# Patient Record
Sex: Female | Born: 1945
Health system: Southern US, Community
[De-identification: ages and names within clinical notes are randomized; demographics above are authoritative.]

## PROBLEM LIST (undated history)

## (undated) DIAGNOSIS — R011 Cardiac murmur, unspecified: Secondary | ICD-10-CM

## (undated) DIAGNOSIS — M069 Rheumatoid arthritis, unspecified: Secondary | ICD-10-CM

## (undated) DIAGNOSIS — M3500 Sicca syndrome, unspecified: Secondary | ICD-10-CM

## (undated) DIAGNOSIS — E039 Hypothyroidism, unspecified: Secondary | ICD-10-CM

## (undated) DIAGNOSIS — J189 Pneumonia, unspecified organism: Secondary | ICD-10-CM

## (undated) DIAGNOSIS — C801 Malignant (primary) neoplasm, unspecified: Secondary | ICD-10-CM

## (undated) HISTORY — PX: TONSILLECTOMY: SUR1361

## (undated) HISTORY — PX: DILATION AND CURETTAGE OF UTERUS: SHX78

---

## 1998-01-11 ENCOUNTER — Ambulatory Visit (HOSPITAL_COMMUNITY): Admission: RE | Admit: 1998-01-11 | Discharge: 1998-01-11 | Payer: Self-pay | Admitting: Obstetrics and Gynecology

## 1998-01-30 ENCOUNTER — Other Ambulatory Visit: Admission: RE | Admit: 1998-01-30 | Discharge: 1998-01-30 | Payer: Self-pay | Admitting: Obstetrics and Gynecology

## 1999-01-14 ENCOUNTER — Ambulatory Visit (HOSPITAL_COMMUNITY): Admission: RE | Admit: 1999-01-14 | Discharge: 1999-01-14 | Payer: Self-pay | Admitting: Obstetrics and Gynecology

## 1999-01-14 ENCOUNTER — Encounter: Payer: Self-pay | Admitting: Obstetrics and Gynecology

## 1999-04-03 ENCOUNTER — Other Ambulatory Visit: Admission: RE | Admit: 1999-04-03 | Discharge: 1999-04-03 | Payer: Self-pay | Admitting: Family Medicine

## 2000-01-19 ENCOUNTER — Encounter: Payer: Self-pay | Admitting: Obstetrics and Gynecology

## 2000-01-19 ENCOUNTER — Ambulatory Visit (HOSPITAL_COMMUNITY): Admission: RE | Admit: 2000-01-19 | Discharge: 2000-01-19 | Payer: Self-pay | Admitting: Family Medicine

## 2000-04-20 ENCOUNTER — Other Ambulatory Visit: Admission: RE | Admit: 2000-04-20 | Discharge: 2000-04-20 | Payer: Self-pay | Admitting: Obstetrics and Gynecology

## 2000-12-08 ENCOUNTER — Ambulatory Visit (HOSPITAL_COMMUNITY): Admission: RE | Admit: 2000-12-08 | Discharge: 2000-12-08 | Payer: Self-pay | Admitting: Gastroenterology

## 2001-01-19 ENCOUNTER — Encounter: Payer: Self-pay | Admitting: Obstetrics and Gynecology

## 2001-01-19 ENCOUNTER — Ambulatory Visit (HOSPITAL_COMMUNITY): Admission: RE | Admit: 2001-01-19 | Discharge: 2001-01-19 | Payer: Self-pay | Admitting: Obstetrics and Gynecology

## 2001-01-25 ENCOUNTER — Encounter: Admission: RE | Admit: 2001-01-25 | Discharge: 2001-01-25 | Payer: Self-pay | Admitting: Obstetrics and Gynecology

## 2001-01-25 ENCOUNTER — Encounter: Payer: Self-pay | Admitting: Obstetrics and Gynecology

## 2001-04-29 ENCOUNTER — Other Ambulatory Visit: Admission: RE | Admit: 2001-04-29 | Discharge: 2001-04-29 | Payer: Self-pay | Admitting: Obstetrics and Gynecology

## 2002-01-31 ENCOUNTER — Encounter: Payer: Self-pay | Admitting: Obstetrics and Gynecology

## 2002-01-31 ENCOUNTER — Ambulatory Visit (HOSPITAL_COMMUNITY): Admission: RE | Admit: 2002-01-31 | Discharge: 2002-01-31 | Payer: Self-pay | Admitting: Obstetrics and Gynecology

## 2002-05-18 ENCOUNTER — Other Ambulatory Visit: Admission: RE | Admit: 2002-05-18 | Discharge: 2002-05-18 | Payer: Self-pay | Admitting: Obstetrics and Gynecology

## 2003-02-13 ENCOUNTER — Ambulatory Visit (HOSPITAL_COMMUNITY): Admission: RE | Admit: 2003-02-13 | Discharge: 2003-02-13 | Payer: Self-pay | Admitting: Obstetrics and Gynecology

## 2003-06-05 ENCOUNTER — Other Ambulatory Visit: Admission: RE | Admit: 2003-06-05 | Discharge: 2003-06-05 | Payer: Self-pay | Admitting: Obstetrics and Gynecology

## 2004-02-20 ENCOUNTER — Ambulatory Visit (HOSPITAL_COMMUNITY): Admission: RE | Admit: 2004-02-20 | Discharge: 2004-02-20 | Payer: Self-pay | Admitting: Obstetrics and Gynecology

## 2004-06-20 ENCOUNTER — Other Ambulatory Visit: Admission: RE | Admit: 2004-06-20 | Discharge: 2004-06-20 | Payer: Self-pay | Admitting: Obstetrics and Gynecology

## 2005-04-02 ENCOUNTER — Ambulatory Visit (HOSPITAL_COMMUNITY): Admission: RE | Admit: 2005-04-02 | Discharge: 2005-04-02 | Payer: Self-pay | Admitting: Obstetrics and Gynecology

## 2005-06-24 ENCOUNTER — Other Ambulatory Visit: Admission: RE | Admit: 2005-06-24 | Discharge: 2005-06-24 | Payer: Self-pay | Admitting: Obstetrics and Gynecology

## 2006-04-07 ENCOUNTER — Ambulatory Visit (HOSPITAL_COMMUNITY): Admission: RE | Admit: 2006-04-07 | Discharge: 2006-04-07 | Payer: Self-pay | Admitting: Obstetrics and Gynecology

## 2006-06-02 ENCOUNTER — Ambulatory Visit (HOSPITAL_COMMUNITY): Admission: RE | Admit: 2006-06-02 | Discharge: 2006-06-02 | Payer: Self-pay | Admitting: Gastroenterology

## 2007-04-12 ENCOUNTER — Ambulatory Visit (HOSPITAL_COMMUNITY): Admission: RE | Admit: 2007-04-12 | Discharge: 2007-04-12 | Payer: Self-pay | Admitting: Obstetrics and Gynecology

## 2008-04-17 ENCOUNTER — Ambulatory Visit (HOSPITAL_COMMUNITY): Admission: RE | Admit: 2008-04-17 | Discharge: 2008-04-17 | Payer: Self-pay | Admitting: Obstetrics and Gynecology

## 2010-04-24 ENCOUNTER — Ambulatory Visit (HOSPITAL_COMMUNITY)
Admission: RE | Admit: 2010-04-24 | Discharge: 2010-04-24 | Payer: Self-pay | Source: Home / Self Care | Attending: Obstetrics and Gynecology | Admitting: Obstetrics and Gynecology

## 2010-08-15 NOTE — Op Note (Signed)
NAME:  Sheryl Foster, Sheryl Foster NO.:  [id number]   MEDICAL RECORD NO.:  [id number]          PATIENT TYPE:  AMB   LOCATION:  ENDO                         FACILITY:  [facility]   PHYSICIAN:  Anselmo Rod, M.D.  DATE OF BIRTH:  09-19-1945   DATE OF PROCEDURE:  06/02/2006  DATE OF DISCHARGE:                               OPERATIVE REPORT   PROCEDURE PERFORMED:  Screening colonoscopy.   ENDOSCOPIST:  Anselmo Rod, M.D.   INSTRUMENT USED:  Pentax video colonoscope.   INDICATIONS FOR PROCEDURE:  A 65 year old white female undergoing  screening colonoscopy.  The patient has a family history of colon cancer  in a maternal grandfather.  Rule out colonic polyps, masses, etc.   PREPROCEDURE PREPARATION:  Informed consent was procured from the  patient.  The patient was fasted for 4 hours prior to the procedure and  prepped with 20 Osmoprep pills the night of and 12 Osmoprep pills the  morning of the procedure.  Risks and benefits of the procedure including  a 10% miss rate of cancer and polyp were discussed with the patient as  well.   PREPROCEDURE PHYSICAL:  VITAL SIGNS:  The patient had stable vital  signs.  NECK:  Supple.  CHEST:  Clear to auscultation.  HEART:  S1 and S2 regular.  ABDOMEN:  Soft with normal bowel sounds.   DESCRIPTION OF PROCEDURE:  The patient was placed in left lateral  decubitus position and sedated with 100 mcg of fentanyl and 8 mg of  Versed given intravenously in slow incremental doses. Once the patient  was adequately sedated and maintained on low-flow oxygen and continuous  cardiac monitoring, the Pentax video colonoscope was advanced from the  rectum to the cecum.  The appendiceal orifice and ileocecal valve were  clearly visualized and photographed. A few sigmoid diverticula were  noted. Small internal and external hemorrhoids were seen on retroflexion  in the anal inspection, respectively.  No masses or polyps identified.  The transverse  colon, right colon, cecum and terminal ileum appeared  normal.  The patient tolerated the procedure well without immediate  complications.   IMPRESSION:  1. Small nonbleeding internal and external hemorrhoids.  2. Few early sigmoid diverticula.  3. Normal-appearing terminal ileum.  4. No masses or polyps seen.   RECOMMENDATIONS:  1. Continue high fiber diet with liberal fluid intake.  2. Repeat colonoscopy in the next 10 years as the patient has already      had two unrevealing colonoscopies within      the last 10 years.  3. Outpatient follow-up as need arises in the future.  4. Brochures on diverticulosis and a high fiber diet have been given      to the patient for education.      Anselmo Rod, M.D.  Electronically Signed     JNM/MEDQ  D:  06/02/2006  T:  06/02/2006  Job:  [id number]   cc:   Soyla Murphy. Renne Crigler, M.D.

## 2010-08-15 NOTE — Procedures (Signed)
[place]  Patient:    Sheryl Foster, Sheryl Foster Visit Number: [id number] MRN: [id number]          Service Type: END Location: ENDO Attending Physician:  Charna Elizabeth Dictated by:   Anselmo Rod, M.D. Proc. Date: 12/08/00 Admit Date:  12/08/2000   CC:         Lilia Pro, M.D.   Procedure Report  DATE OF BIRTH:  May 12, 1941.  PROCEDURE:  Colonoscopy.  ENDOSCOPIST:  Anselmo Rod, M.D.  INSTRUMENT USED:  Olympus video colonoscope.  INDICATION FOR PROCEDURE:  A 65 year old white female with a history of rectal bleeding and a family history of colon cancer.  Rule out colonic polyps, masses, hemorrhoids, etc.  PREPROCEDURE PREPARATION:  Informed consent was procured from the patient. The patient was fasted for eight hours prior to the procedure and prepped with a bottle of magnesium citrate and a gallon of NuLytely the night prior to the procedure.  PREPROCEDURE PHYSICAL:  VITAL SIGNS:  The patient had stable vital signs.  NECK:  Supple.  CHEST:  Clear to auscultation.  S1, S2 regular.  ABDOMEN:  Soft with normal bowel sounds.  DESCRIPTION OF PROCEDURE:  The patient was placed in the left lateral decubitus position and sedated with 70 mg of Demerol and 7 mg of Versed intravenously.  Once the patient was adequately sedate and maintained on low-flow oxygen and continuous cardiac monitoring, the Olympus video colonoscope was advanced from the rectum to the cecum without difficulty. Except for a few left-sided diverticula and a small external hemorrhoid, no other abnormalities were seen.  IMPRESSION: 1. Normal colon except for a few left-sided diverticula. 2. Small external hemorrhoid.  RECOMMENDATIONS: 1. A high-fiber diet has been recommended for the patient. 2. Repeat colorectal cancer screening is recommended in five years unless    the patient were to develop any abnormal symptoms in the interim. 3. With regard  to diverticular disease, the patient has been advised to    follow a high-fiber diet and follow up in the office as the need arises. Dictated by:   Anselmo Rod, M.D. Attending Physician:  Charna Elizabeth DD:  12/08/00 TD:  12/08/00 Job: [id number]

## 2012-02-24 ENCOUNTER — Other Ambulatory Visit (HOSPITAL_COMMUNITY): Payer: Self-pay | Admitting: Obstetrics and Gynecology

## 2012-02-24 DIAGNOSIS — Z1231 Encounter for screening mammogram for malignant neoplasm of breast: Secondary | ICD-10-CM

## 2012-03-18 ENCOUNTER — Ambulatory Visit (HOSPITAL_COMMUNITY)
Admission: RE | Admit: 2012-03-18 | Discharge: 2012-03-18 | Disposition: A | Discharge status: Home / Self Care | Payer: Medicare Other | Source: Ambulatory Visit | Attending: Obstetrics and Gynecology | Admitting: Obstetrics and Gynecology

## 2012-03-18 DIAGNOSIS — Z1231 Encounter for screening mammogram for malignant neoplasm of breast: Secondary | ICD-10-CM | POA: Insufficient documentation

## 2013-09-20 ENCOUNTER — Other Ambulatory Visit: Payer: Self-pay | Admitting: Internal Medicine

## 2013-09-20 DIAGNOSIS — I8390 Asymptomatic varicose veins of unspecified lower extremity: Secondary | ICD-10-CM

## 2013-10-04 ENCOUNTER — Encounter (INDEPENDENT_AMBULATORY_CARE_PROVIDER_SITE_OTHER): Payer: Self-pay

## 2013-10-04 ENCOUNTER — Ambulatory Visit
Admission: RE | Admit: 2013-10-04 | Discharge: 2013-10-04 | Disposition: A | Discharge status: Home / Self Care | Payer: Medicare Other | Source: Ambulatory Visit | Attending: Internal Medicine | Admitting: Internal Medicine

## 2013-10-04 DIAGNOSIS — I8390 Asymptomatic varicose veins of unspecified lower extremity: Secondary | ICD-10-CM

## 2013-10-09 ENCOUNTER — Telehealth: Payer: Self-pay | Admitting: Emergency Medicine

## 2013-10-09 NOTE — Telephone Encounter (Signed)
LMOAM AT H# TO MAKE PT AWARE THAT BOTH OF HER INSURANCES WILL COVER IF DEEMED MEDICALLY NECESSARY.  WILL CALL HER WHEN WE HAVE THE SCHEDULE FOR THE FALL TIME.

## 2013-11-30 ENCOUNTER — Other Ambulatory Visit (HOSPITAL_COMMUNITY): Payer: Self-pay | Admitting: Interventional Radiology

## 2013-11-30 DIAGNOSIS — I83811 Varicose veins of right lower extremities with pain: Secondary | ICD-10-CM

## 2015-03-16 ENCOUNTER — Encounter (HOSPITAL_COMMUNITY): Payer: Self-pay | Admitting: *Deleted

## 2015-03-16 ENCOUNTER — Inpatient Hospital Stay (HOSPITAL_COMMUNITY)
Admission: EM | Admit: 2015-03-16 | Discharge: 2015-03-18 | DRG: 482 | Disposition: A | Discharge status: Home / Self Care | Payer: PPO | Attending: Internal Medicine | Admitting: Internal Medicine

## 2015-03-16 ENCOUNTER — Emergency Department (HOSPITAL_COMMUNITY): Payer: PPO

## 2015-03-16 DIAGNOSIS — E039 Hypothyroidism, unspecified: Secondary | ICD-10-CM | POA: Diagnosis present

## 2015-03-16 DIAGNOSIS — W1830XA Fall on same level, unspecified, initial encounter: Secondary | ICD-10-CM | POA: Diagnosis present

## 2015-03-16 DIAGNOSIS — I10 Essential (primary) hypertension: Secondary | ICD-10-CM | POA: Diagnosis present

## 2015-03-16 DIAGNOSIS — M3500 Sicca syndrome, unspecified: Secondary | ICD-10-CM | POA: Diagnosis present

## 2015-03-16 DIAGNOSIS — Z01811 Encounter for preprocedural respiratory examination: Secondary | ICD-10-CM

## 2015-03-16 DIAGNOSIS — S7290XA Unspecified fracture of unspecified femur, initial encounter for closed fracture: Secondary | ICD-10-CM | POA: Diagnosis present

## 2015-03-16 DIAGNOSIS — S72011A Unspecified intracapsular fracture of right femur, initial encounter for closed fracture: Secondary | ICD-10-CM | POA: Diagnosis present

## 2015-03-16 DIAGNOSIS — S72001A Fracture of unspecified part of neck of right femur, initial encounter for closed fracture: Secondary | ICD-10-CM | POA: Diagnosis present

## 2015-03-16 DIAGNOSIS — Z419 Encounter for procedure for purposes other than remedying health state, unspecified: Secondary | ICD-10-CM

## 2015-03-16 DIAGNOSIS — Z888 Allergy status to other drugs, medicaments and biological substances status: Secondary | ICD-10-CM | POA: Diagnosis not present

## 2015-03-16 DIAGNOSIS — M810 Age-related osteoporosis without current pathological fracture: Secondary | ICD-10-CM | POA: Diagnosis present

## 2015-03-16 DIAGNOSIS — Z79899 Other long term (current) drug therapy: Secondary | ICD-10-CM | POA: Diagnosis not present

## 2015-03-16 DIAGNOSIS — S7291XA Unspecified fracture of right femur, initial encounter for closed fracture: Secondary | ICD-10-CM | POA: Diagnosis present

## 2015-03-16 DIAGNOSIS — M069 Rheumatoid arthritis, unspecified: Secondary | ICD-10-CM

## 2015-03-16 HISTORY — DX: Sjogren syndrome, unspecified: M35.00

## 2015-03-16 HISTORY — DX: Rheumatoid arthritis, unspecified: M06.9

## 2015-03-16 HISTORY — DX: Hypothyroidism, unspecified: E03.9

## 2015-03-16 LAB — CBC WITH DIFFERENTIAL/PLATELET
Basophils Absolute: 0 10*3/uL (ref 0.0–0.1)
Basophils Relative: 0 %
EOS ABS: 0 10*3/uL (ref 0.0–0.7)
EOS PCT: 0 %
HCT: 42.6 % (ref 36.0–46.0)
HEMOGLOBIN: 13.9 g/dL (ref 12.0–15.0)
LYMPHS ABS: 1.4 10*3/uL (ref 0.7–4.0)
LYMPHS PCT: 15 %
MCH: 32 pg (ref 26.0–34.0)
MCHC: 32.6 g/dL (ref 30.0–36.0)
MCV: 98.2 fL (ref 78.0–100.0)
MONOS PCT: 4 %
Monocytes Absolute: 0.4 10*3/uL (ref 0.1–1.0)
NEUTROS PCT: 81 %
Neutro Abs: 7.5 10*3/uL (ref 1.7–7.7)
Platelets: 210 10*3/uL (ref 150–400)
RBC: 4.34 MIL/uL (ref 3.87–5.11)
RDW: 14.2 % (ref 11.5–15.5)
WBC: 9.3 10*3/uL (ref 4.0–10.5)

## 2015-03-16 LAB — COMPREHENSIVE METABOLIC PANEL
ALK PHOS: 79 U/L (ref 38–126)
ALT: 27 U/L (ref 14–54)
ANION GAP: 4 — AB (ref 5–15)
AST: 29 U/L (ref 15–41)
Albumin: 3.3 g/dL — ABNORMAL LOW (ref 3.5–5.0)
BUN: 11 mg/dL (ref 6–20)
CALCIUM: 9.4 mg/dL (ref 8.9–10.3)
CO2: 27 mmol/L (ref 22–32)
CREATININE: 0.78 mg/dL (ref 0.44–1.00)
Chloride: 110 mmol/L (ref 101–111)
Glucose, Bld: 106 mg/dL — ABNORMAL HIGH (ref 65–99)
Potassium: 4.1 mmol/L (ref 3.5–5.1)
Sodium: 141 mmol/L (ref 135–145)
TOTAL PROTEIN: 5.8 g/dL — AB (ref 6.5–8.1)
Total Bilirubin: 0.5 mg/dL (ref 0.3–1.2)

## 2015-03-16 LAB — URINALYSIS, ROUTINE W REFLEX MICROSCOPIC
Bilirubin Urine: NEGATIVE
GLUCOSE, UA: NEGATIVE mg/dL
Hgb urine dipstick: NEGATIVE
Ketones, ur: NEGATIVE mg/dL
LEUKOCYTES UA: NEGATIVE
NITRITE: NEGATIVE
PROTEIN: NEGATIVE mg/dL
Specific Gravity, Urine: 1.009 (ref 1.005–1.030)
pH: 6.5 (ref 5.0–8.0)

## 2015-03-16 LAB — I-STAT CHEM 8, ED
BUN: 16 mg/dL (ref 6–20)
CALCIUM ION: 1.19 mmol/L (ref 1.13–1.30)
CHLORIDE: 107 mmol/L (ref 101–111)
Creatinine, Ser: 0.8 mg/dL (ref 0.44–1.00)
GLUCOSE: 105 mg/dL — AB (ref 65–99)
HCT: 45 % (ref 36.0–46.0)
Hemoglobin: 15.3 g/dL — ABNORMAL HIGH (ref 12.0–15.0)
POTASSIUM: 3.9 mmol/L (ref 3.5–5.1)
Sodium: 143 mmol/L (ref 135–145)
TCO2: 26 mmol/L (ref 0–100)

## 2015-03-16 LAB — MRSA PCR SCREENING: MRSA BY PCR: NEGATIVE

## 2015-03-16 MED ORDER — HYDROCODONE-ACETAMINOPHEN 5-325 MG PO TABS: 1.0000 | ORAL_TABLET | Freq: Four times a day (QID) | ORAL | Status: DC | PRN

## 2015-03-16 MED ORDER — IBUPROFEN 200 MG PO TABS
800.0000 mg | ORAL_TABLET | Freq: Once | ORAL | Status: DC
  Filled 2015-03-16: qty 1

## 2015-03-16 MED ORDER — CHOLECALCIFEROL 10 MCG (400 UNIT) PO TABS
400.0000 [IU] | ORAL_TABLET | Freq: Every day | ORAL | Status: DC
  Administered 2015-03-18: 400 [IU] via ORAL
  Filled 2015-03-16: qty 1

## 2015-03-16 MED ORDER — ONDANSETRON HCL 4 MG/2ML IJ SOLN: 4.0000 mg | Freq: Four times a day (QID) | INTRAMUSCULAR | Status: DC | PRN

## 2015-03-16 MED ORDER — LEVOTHYROXINE SODIUM 88 MCG PO TABS
88.0000 ug | ORAL_TABLET | Freq: Every day | ORAL | Status: DC
  Administered 2015-03-18: 88 ug via ORAL
  Filled 2015-03-16: qty 1

## 2015-03-16 MED ORDER — FOLIC ACID 1 MG PO TABS
1.0000 mg | ORAL_TABLET | Freq: Every day | ORAL | Status: DC
  Administered 2015-03-16 – 2015-03-18 (×2): 1 mg via ORAL
  Filled 2015-03-16 (×2): qty 1

## 2015-03-16 MED ORDER — BISACODYL 10 MG RE SUPP
10.0000 mg | Freq: Every day | RECTAL | Status: DC | PRN
Start: 2015-03-16 — End: 2015-03-18

## 2015-03-16 MED ORDER — OXYCODONE HCL 5 MG PO TABS
5.0000 mg | ORAL_TABLET | Freq: Once | ORAL | Status: DC
  Filled 2015-03-16: qty 1

## 2015-03-16 MED ORDER — ACETAMINOPHEN 500 MG PO TABS
1000.0000 mg | ORAL_TABLET | Freq: Once | ORAL | Status: AC
  Administered 2015-03-16: 1000 mg via ORAL
  Filled 2015-03-16: qty 2

## 2015-03-16 MED ORDER — CHLORHEXIDINE GLUCONATE 4 % EX LIQD
60.0000 mL | Freq: Once | CUTANEOUS | Status: DC
  Filled 2015-03-16 (×2): qty 60

## 2015-03-16 MED ORDER — MORPHINE SULFATE (PF) 2 MG/ML IV SOLN: 0.5000 mg | INTRAVENOUS | Status: DC | PRN

## 2015-03-16 MED ORDER — HYDROCODONE-ACETAMINOPHEN 5-325 MG PO TABS
1.0000 | ORAL_TABLET | Freq: Four times a day (QID) | ORAL | Status: DC | PRN
  Filled 2015-03-16: qty 2

## 2015-03-16 MED ORDER — CEFAZOLIN SODIUM-DEXTROSE 2-3 GM-% IV SOLR
2.0000 g | INTRAVENOUS | Status: AC
  Administered 2015-03-17: 2 g via INTRAVENOUS
  Filled 2015-03-16: qty 50

## 2015-03-16 MED ORDER — CHLORHEXIDINE GLUCONATE 4 % EX LIQD: 60.0000 mL | Freq: Once | CUTANEOUS | Status: DC

## 2015-03-16 MED ORDER — POLYVINYL ALCOHOL 1.4 % OP SOLN
1.0000 [drp] | OPHTHALMIC | Status: DC | PRN
  Filled 2015-03-16: qty 15

## 2015-03-16 MED ORDER — POLYETHYLENE GLYCOL 3350 17 G PO PACK: 17.0000 g | PACK | Freq: Every day | ORAL | Status: DC | PRN

## 2015-03-16 MED ORDER — ENOXAPARIN SODIUM 40 MG/0.4ML ~~LOC~~ SOLN
40.0000 mg | SUBCUTANEOUS | Status: DC
  Administered 2015-03-16: 40 mg via SUBCUTANEOUS
  Filled 2015-03-16: qty 0.4

## 2015-03-16 MED ORDER — ACETAMINOPHEN 500 MG PO TABS
500.0000 mg | ORAL_TABLET | ORAL | Status: DC | PRN
  Administered 2015-03-16: 500 mg via ORAL
  Filled 2015-03-16 (×2): qty 1

## 2015-03-16 MED ORDER — ACETAMINOPHEN 500 MG PO TABS: 1000.0000 mg | ORAL_TABLET | Freq: Once | ORAL | Status: DC

## 2015-03-16 MED ORDER — POTASSIUM CHLORIDE IN NACL 20-0.45 MEQ/L-% IV SOLN
INTRAVENOUS | Status: DC
  Administered 2015-03-17: 100 mL/h via INTRAVENOUS
  Filled 2015-03-16 (×4): qty 1000

## 2015-03-16 MED ORDER — HEPARIN SODIUM (PORCINE) 5000 UNIT/ML IJ SOLN: 5000.0000 [IU] | Freq: Three times a day (TID) | INTRAMUSCULAR | Status: DC

## 2015-03-16 MED ORDER — POLYETHYLENE GLYCOL 3350 17 G PO PACK
17.0000 g | PACK | Freq: Every day | ORAL | Status: DC | PRN
Start: 2015-03-16 — End: 2015-03-18
  Administered 2015-03-17: 17 g via ORAL
  Filled 2015-03-16: qty 1

## 2015-03-16 NOTE — ED Notes (Signed)
Pt c/o R hip pain post fall from standing position today on pavement, pt ambulatory with pain, no external rotation or shortening, pt able to wiggle toes, pt A&O x4, denies hitting head & LOC

## 2015-03-16 NOTE — ED Provider Notes (Signed)
CSN: [id number]     Arrival date & time 03/16/15  [id number] History   First MD Initiated Contact with Patient 03/16/15 [phone]     Chief Complaint  Patient presents with  . Hip Pain  . Fall     (Consider location/radiation/quality/duration/timing/severity/associated sxs/prior Treatment) Patient is a 69 y.o. female presenting with hip pain and fall. The history is provided by the patient.  Hip Pain This is a new problem. The current episode started 1 to 2 hours ago. The problem occurs constantly. The problem has not changed since onset.Pertinent negatives include no chest pain, no headaches and no shortness of breath. The symptoms are aggravated by walking, swallowing and bending. Nothing relieves the symptoms. She has tried nothing for the symptoms. The treatment provided no relief.  Fall Pertinent negatives include no chest pain, no headaches and no shortness of breath.   69 yo F with a chief complaint of a fall. Patient [place] of the [employer] and lost her balance landed mostly on her right hip. Complaining of pain to the right lateral aspect of the leg. Was able to walk into her car and drive home but having continued pain when trying to bear weight came to emergency department. Denies head injury denies loss of consciousness denies neck pain back pain abdominal pain chest pain shortness breath.  No past medical history on file. No past surgical history on file. No family history on file. Social History  Substance Use Topics  . Smoking status: Never Smoker   . Smokeless tobacco: None  . Alcohol Use: 4.2 oz/week    7 Glasses of wine per week   OB History    No data available     Review of Systems  Constitutional: Negative for fever and chills.  HENT: Negative for congestion and rhinorrhea.   Eyes: Negative for redness and visual disturbance.  Respiratory: Negative for shortness of breath and wheezing.   Cardiovascular: Negative for chest pain and palpitations.   Gastrointestinal: Negative for nausea and vomiting.  Genitourinary: Negative for dysuria and urgency.  Musculoskeletal: Positive for myalgias and arthralgias.  Skin: Negative for pallor and wound.  Neurological: Negative for dizziness and headaches.      Allergies  Tobramycin  Home Medications   Prior to Admission medications   Medication Sig Start Date End Date Taking? Authorizing Provider  CALCIUM-VITAMIN D PO Take 1 capsule by mouth daily.   Yes Historical Provider, MD  Cholecalciferol (VITAMIN D PO) Take 1 capsule by mouth daily.   Yes Historical Provider, MD  folic acid (FOLVITE) 1 MG tablet Take 1 tablet by mouth daily. 10/18/09  Yes Historical Provider, MD  levothyroxine (SYNTHROID, LEVOTHROID) 88 MCG tablet Take 1 tablet by mouth daily. 11/15/09  Yes Historical Provider, MD  meloxicam (MOBIC) 15 MG tablet Take 1 tablet by mouth as needed. Patient takes 1/2 tablet 4 times per week 10/23/09  Yes Historical Provider, MD  methotrexate (RHEUMATREX) 2.5 MG tablet Take 3 tablets by mouth once a week. 10/28/09  Yes Historical Provider, MD  Omega-3 Fatty Acids (FISH OIL PO) Take 1 capsule by mouth 2 (two) times daily.   Yes Historical Provider, MD   BP 150/87 mmHg  Pulse 74  Resp 22  SpO2 100% Physical Exam  Constitutional: She is oriented to person, place, and time. She appears well-developed and well-nourished. No distress.  HENT:  Head: Normocephalic and atraumatic.  Eyes: EOM are normal. Pupils are equal, round, and reactive to light.  Neck: Normal range  of motion. Neck supple.  Cardiovascular: Normal rate and regular rhythm.  Exam reveals no gallop and no friction rub.   No murmur heard. Pulmonary/Chest: Effort normal. She has no wheezes. She has no rales.  Abdominal: Soft. She exhibits no distension. There is no tenderness. There is no rebound and no guarding.  Musculoskeletal: She exhibits tenderness (tender palpation worst over the greater trochanter. Patient with some mild  tenderness with internal rotation. Pulse motor and sensation is intact distally). She exhibits no edema.  Neurological: She is alert and oriented to person, place, and time.  Skin: Skin is warm and dry. She is not diaphoretic.  Psychiatric: She has a normal mood and affect. Her behavior is normal.  Nursing note and vitals reviewed.   ED Course  Procedures (including critical care time) Labs Review Labs Reviewed  I-STAT CHEM 8, ED - Abnormal; Notable for the following:    Glucose, Bld 105 (*)    Hemoglobin 15.3 (*)    All other components within normal limits  CBC WITH DIFFERENTIAL/PLATELET  COMPREHENSIVE METABOLIC PANEL    Imaging Review Dg Chest Port 1 View  03/16/2015  CLINICAL DATA:  Preoperative chest evaluation, hip fracture EXAM: PORTABLE CHEST - 1 VIEW COMPARISON:  None. FINDINGS: The heart size and mediastinal contours are within normal limits. Both lungs are clear. The visualized skeletal structures are unremarkable. IMPRESSION: No active disease. Electronically Signed   By: Inez Catalina M.D.   On: 03/16/2015 11:30   Dg Hip Unilat  With Pelvis 2-3 Views Right  03/16/2015  CLINICAL DATA:  Recent fall with right hip pain, initial encounter EXAM: DG HIP (WITH OR WITHOUT PELVIS) 2-3V RIGHT COMPARISON:  None. FINDINGS: There is a mildly displaced subcapital right femoral neck fracture with impaction at the fracture site. No other abnormality is noted. No soft tissue changes are seen. IMPRESSION: Subcapital femoral neck fracture on the right Electronically Signed   By: Inez Catalina M.D.   On: 03/16/2015 10:45   Dg Femur, Min 2 Views Right  03/16/2015  CLINICAL DATA:  Recent fall with right leg pain EXAM: RIGHT FEMUR 2 VIEWS COMPARISON:  None. FINDINGS: Subcapital femoral neck fracture with mild impaction at the fracture site. The remainder the femur is within normal limits. IMPRESSION: Subcapital right femoral neck fracture. Electronically Signed   By: Inez Catalina M.D.   On:  03/16/2015 10:46   I have personally reviewed and evaluated these images and lab results as part of my medical decision-making.   EKG Interpretation None      MDM   Final diagnoses:  Closed fracture of neck of right femur, initial encounter [facility])    69 yo F with a chief complaint of right lateral hip pain. Will obtain an x-ray to evaluate for possible fracture.   Subcapital femoral neck fracture on x-ray. Consult ortho obtain basic labs chest x-ray EKG.   Discussed the case with Dr. Percell Miller, orthopedics will do the surgery likely tomorrow morning. Hospitalist admit.  The patients results and plan were reviewed and discussed.   Any x-rays performed were independently reviewed by myself.   Differential diagnosis were considered with the presenting HPI.  Medications  ibuprofen (ADVIL,MOTRIN) tablet 800 mg (800 mg Oral Not Given 03/16/15 1005)  oxyCODONE (Oxy IR/ROXICODONE) immediate release tablet 5 mg (5 mg Oral Not Given 03/16/15 1005)  acetaminophen (TYLENOL) tablet 1,000 mg (1,000 mg Oral Given 03/16/15 1004)    Filed Vitals:   03/16/15 0945 03/16/15 1000 03/16/15 1100 03/16/15 1145  BP: 164/80 95/67 150/87   Pulse: 67 63 64 74  Resp:    22  SpO2: 99% 97% 97% 100%    Final diagnoses:  Closed fracture of neck of right femur, initial encounter ([place])    Admission/ observation were discussed with the admitting physician, patient and/or family and they are comfortable with the plan.    Deno Etienne, DO 03/16/15 1151

## 2015-03-16 NOTE — Consult Note (Signed)
ORTHOPAEDIC CONSULTATION  REQUESTING PHYSICIAN: Ripudeep Krystal Eaton, MD  Chief Complaint: R hip pain  HPI: Sheryl Foster is a 69 y.o. female who complains of R hip pain after a mechanical fall earlier today.  Per the patient, she was at the [place] when she lost her balance and fell onto the R hip.  Pain with ambulation afterwards.  Was able to make it back to her car and drive home, but continued to have R hip pain.  Was transported to the ED, where she was found to have a R femoral neck fracture.  Patient denies numbness or tingling down leg.  Patient is ambulatory at baseline without any assistive devices.  No hx of DM or CAD.  Hx of RA.   Past Medical History  Diagnosis Date  . Hypothyroidism   . Rheumatoid arthritis ([place])   . Sjogren's disease ([place])    No past surgical history on file. Social History   Social History  . Marital Status: Single    Spouse Name: N/A  . Number of Children: N/A  . Years of Education: N/A   Social History Main Topics  . Smoking status: Never Smoker   . Smokeless tobacco: None  . Alcohol Use: 4.2 oz/week    7 Glasses of wine per week  . Drug Use: No  . Sexual Activity: Not Asked   Other Topics Concern  . None   Social History Narrative  . None   Family History  Problem Relation Age of Onset  . Diabetes Mother   . Diabetes Brother    Allergies  Allergen Reactions  . Tobramycin Swelling   Prior to Admission medications   Medication Sig Start Date End Date Taking? Authorizing Provider  CALCIUM-VITAMIN D PO Take 1 capsule by mouth daily.   Yes Historical Provider, MD  Cholecalciferol (VITAMIN D PO) Take 1 capsule by mouth daily.   Yes Historical Provider, MD  folic acid (FOLVITE) 1 MG tablet Take 1 tablet by mouth daily. 10/18/09  Yes Historical Provider, MD  levothyroxine (SYNTHROID, LEVOTHROID) 88 MCG tablet Take 1 tablet by mouth daily. 11/15/09  Yes Historical Provider, MD  meloxicam (MOBIC) 15 MG tablet Take 1  tablet by mouth as needed. Patient takes 1/2 tablet 4 times per week 10/23/09  Yes Historical Provider, MD  methotrexate (RHEUMATREX) 2.5 MG tablet Take 3 tablets by mouth once a week. 10/28/09  Yes Historical Provider, MD  Omega-3 Fatty Acids (FISH OIL PO) Take 1 capsule by mouth 2 (two) times daily.   Yes Historical Provider, MD   Dg Chest [facility] 1 View  03/16/2015  CLINICAL DATA:  Preoperative chest evaluation, hip fracture EXAM: PORTABLE CHEST - 1 VIEW COMPARISON:  None. FINDINGS: The heart size and mediastinal contours are within normal limits. Both lungs are clear. The visualized skeletal structures are unremarkable. IMPRESSION: No active disease. Electronically Signed   By: Inez Catalina M.D.   On: 03/16/2015 11:30   Dg Hip Unilat  With Pelvis 2-3 Views Right  03/16/2015  CLINICAL DATA:  Recent fall with right hip pain, initial encounter EXAM: DG HIP (WITH OR WITHOUT PELVIS) 2-3V RIGHT COMPARISON:  None. FINDINGS: There is a mildly displaced subcapital right femoral neck fracture with impaction at the fracture site. No other abnormality is noted. No soft tissue changes are seen. IMPRESSION: Subcapital femoral neck fracture on the right Electronically Signed   By: Inez Catalina M.D.   On: 03/16/2015 10:45   Dg Femur, Min 2  Views Right  03/16/2015  CLINICAL DATA:  Recent fall with right leg pain EXAM: RIGHT FEMUR 2 VIEWS COMPARISON:  None. FINDINGS: Subcapital femoral neck fracture with mild impaction at the fracture site. The remainder the femur is within normal limits. IMPRESSION: Subcapital right femoral neck fracture. Electronically Signed   By: Inez Catalina M.D.   On: 03/16/2015 10:46    Positive ROS: All other systems have been reviewed and were otherwise negative with the exception of those mentioned in the HPI and as above.  Labs cbc  Recent Labs  03/16/15 1100 03/16/15 1127  WBC 9.3  --   HGB 13.9 15.3*  HCT 42.6 45.0  PLT 210  --     Labs inflam No results for input(s): CRP in  the last 72 hours.  Invalid input(s): ESR  Labs coag No results for input(s): INR, PTT in the last 72 hours.  Invalid input(s): PT   Recent Labs  03/16/15 1100 03/16/15 1127  NA 141 143  K 4.1 3.9  CL 110 107  CO2 27  --   GLUCOSE 106* 105*  BUN 11 16  CREATININE 0.78 0.80  CALCIUM 9.4  --     Physical Exam: Filed Vitals:   03/16/15 1230 03/16/15 1320  BP: 149/73 154/82  Pulse: 64 65  Temp:  98.4 F (36.9 C)  Resp: 20 18   General: Alert, no acute distress Cardiovascular: No pedal edema Respiratory: No cyanosis, no use of accessory musculature GI: No organomegaly, abdomen is soft and non-tender Skin: No lesions in the area of chief complaint other than those listed below in MSK exam.  Neurologic: Sensation intact distally Psychiatric: Patient is competent for consent with normal mood and affect Lymphatic: No axillary or cervical lymphadenopathy  MUSCULOSKELETAL:  R hip has mild swelling.  No ecchymosis.  No skin disruption.  Tender to palpation.  Mild pain with log roll.  Sensation intact with 2+ distal pulses.  Other extremities are atraumatic with painless ROM and NVI.  Assessment: R femoral neck fracture after a mechanical fall on 03/16/15  Plan: Discussed images with patient and her family.  Recommending operative repair to help increase mobilization and help control pain.  Discussed risks and benefits.  Patient agrees to proceed with surgical correction.  Will be NPO after midnight.  To the OR tomorrow morning.  Will remain on bedrest till surgery.   Gae Dry, PA-C Cell [phone]   03/16/2015 2:50 PM

## 2015-03-16 NOTE — H&P (Signed)
[facility] History and Physical  TAKAKO ASP [id number] DOB: 08/16/1945 DOA: 03/16/2015  Referring physician: Emergency Department PCP: Horatio Pel, MD   CHIEF COMPLAINT: hip fracture    HPI: Sheryl Foster is a 69 y.o. female fell on right hip leaving [employer] [employer] today. She dropped some eggs on the ground and was trying to get them when fall occurred. No prior history of falls. Patient lives alone. She is physically active, walks 2 miles a day. No other complaints.    ED COURSE:     Labs:   Normal electrolytes, normal hemoglobin 15  Urinalysis: Will obtain    CXR:    No active disease.   EKG:    Right and left arm electrode reversal, interpretation assumes no reversal Sinus or ectopic atrial rhythm Probable left atrial enlargement Right axis deviation Borderline T abnormalities, inferior leads QT 447   Medications  ibuprofen (ADVIL,MOTRIN) tablet 800 mg (800 mg Oral Not Given 03/16/15 1005)  oxyCODONE (Oxy IR/ROXICODONE) immediate release tablet 5 mg (5 mg Oral Not Given 03/16/15 1005)  acetaminophen (TYLENOL) tablet 1,000 mg (1,000 mg Oral Given 03/16/15 1004)    Review of Systems  Constitutional: Negative.   HENT: Negative.   Eyes: Negative.   Respiratory: Negative.   Cardiovascular: Negative.   Gastrointestinal: Negative.   Genitourinary: Negative.   Musculoskeletal: Negative.   Skin: Negative.   Neurological: Negative.   Endo/Heme/Allergies: Negative.   Psychiatric/Behavioral: Negative.    Past Medical History  Diagnosis Date  . Hypothyroidism   . Rheumatoid arthritis ([place])   . Sjogren's disease ([place])    PSH: tonsillectomy  SOCIAL HISTORY:  reports that she has never smoked. She does not have any smokeless tobacco history on file. She reports that she drinks about 4.2 oz of alcohol per week. She reports that she does not use illicit drugs. Lives:  At home alont  Assistive devices:   None needed for  ambulation.   Allergies  Allergen Reactions  . Tobramycin Swelling   Family History  Problem Relation Age of Onset  . Diabetes Mother   . Diabetes Brother    Prior to Admission medications   Medication Sig Start Date End Date Taking? Authorizing Provider  CALCIUM-VITAMIN D PO Take 1 capsule by mouth daily.   Yes Historical Provider, MD  Cholecalciferol (VITAMIN D PO) Take 1 capsule by mouth daily.   Yes Historical Provider, MD  folic acid (FOLVITE) 1 MG tablet Take 1 tablet by mouth daily. 10/18/09  Yes Historical Provider, MD  levothyroxine (SYNTHROID, LEVOTHROID) 88 MCG tablet Take 1 tablet by mouth daily. 11/15/09  Yes Historical Provider, MD  meloxicam (MOBIC) 15 MG tablet Take 1 tablet by mouth as needed. Patient takes 1/2 tablet 4 times per week 10/23/09  Yes Historical Provider, MD  methotrexate (RHEUMATREX) 2.5 MG tablet Take 3 tablets by mouth once a week. 10/28/09  Yes Historical Provider, MD  Omega-3 Fatty Acids (FISH OIL PO) Take 1 capsule by mouth 2 (two) times daily.   Yes Historical Provider, MD   PHYSICAL EXAM: Filed Vitals:   03/16/15 0945 03/16/15 1000 03/16/15 1100 03/16/15 1145  BP: 164/80 95/67 150/87   Pulse: 67 63 64 74  Resp:    22  SpO2: 99% 97% 97% 100%    Wt Readings from Last 3 Encounters:  No data found for Wt    General:  Pleasant white female. Appears calm and comfortable Eyes: PER, normal lids, irises & conjunctiva ENT: grossly normal hearing, lips &  tongue Neck: no LAD, no masses Cardiovascular: RRR, no murmurs. No LE edema.  Respiratory: Respirations even and unlabored. Normal respiratory effort. Lungs CTA bilaterally, no wheezes / rales .   Abdomen: soft, non-distended, non-tender, active bowel sounds. No obvious masses.  Skin: no rash seen on limited exam Musculoskeletal: grossly normal tone BUE/BLE Psychiatric: grossly normal mood and affect, speech fluent and appropriate Neurologic: grossly non-focal.         LABS ON ADMISSION:    Basic  Metabolic Panel:  Recent Labs Lab 03/16/15 1127  NA 143  K 3.9  CL 107  GLUCOSE 105*  BUN 16  CREATININE 0.80   CBC:  Recent Labs Lab 03/16/15 1127  HGB 15.3*  HCT 45.0    Radiological Exams on Admission: Dg Chest Port 1 View  03/16/2015  CLINICAL DATA:  Preoperative chest evaluation, hip fracture EXAM: PORTABLE CHEST - 1 VIEW COMPARISON:  None. FINDINGS: The heart size and mediastinal contours are within normal limits. Both lungs are clear. The visualized skeletal structures are unremarkable. IMPRESSION: No active disease. Electronically Signed   By: Inez Catalina M.D.   On: 03/16/2015 11:30   Dg Hip Unilat  With Pelvis 2-3 Views Right  03/16/2015  CLINICAL DATA:  Recent fall with right hip pain, initial encounter EXAM: DG HIP (WITH OR WITHOUT PELVIS) 2-3V RIGHT COMPARISON:  None. FINDINGS: There is a mildly displaced subcapital right femoral neck fracture with impaction at the fracture site. No other abnormality is noted. No soft tissue changes are seen. IMPRESSION: Subcapital femoral neck fracture on the right Electronically Signed   By: Inez Catalina M.D.   On: 03/16/2015 10:45   Dg Femur, Min 2 Views Right  03/16/2015  CLINICAL DATA:  Recent fall with right leg pain EXAM: RIGHT FEMUR 2 VIEWS COMPARISON:  None. FINDINGS: Subcapital femoral neck fracture with mild impaction at the fracture site. The remainder the femur is within normal limits. IMPRESSION: Subcapital right femoral neck fracture. Electronically Signed   By: Inez Catalina M.D.   On: 03/16/2015 10:46     ASSESSMENT / PLAN   Femoral neck fracture, right, closed, initial encounter. Orthopedics called by EDP. They will see patient today and plan for repair of fracture tomorrow.  -admit to medical bed.  -pre-op urinalysis -analgesics -NPO after midnight  Hypothyroidism. -continue home Synthroid.  Rheumatoid arthritis, on chronic Methotrexate. Next dose due Thursday.   Sjogrens disease. -Continue home eye  drops / vitamins  CONSULTANTS:  Orthopedics   Code Status: full code DVT Prophylaxis: Heparin Family Communication:  Patient alert, oriented and understands plan of care.    Disposition Plan: Discharge to home or rehab center in 2-3 days   Time spent: 60 minutes Tye Savoy  NP [facility] Pager [phone]

## 2015-03-16 NOTE — ED Notes (Signed)
Admitting at bedside 

## 2015-03-17 ENCOUNTER — Inpatient Hospital Stay (HOSPITAL_COMMUNITY): Payer: PPO | Admitting: Anesthesiology

## 2015-03-17 ENCOUNTER — Inpatient Hospital Stay (HOSPITAL_COMMUNITY): Payer: PPO

## 2015-03-17 ENCOUNTER — Encounter (HOSPITAL_COMMUNITY): Payer: Self-pay | Admitting: Certified Registered Nurse Anesthetist

## 2015-03-17 ENCOUNTER — Encounter (HOSPITAL_COMMUNITY)
Admission: EM | Disposition: A | Discharge status: Home / Self Care | Payer: Self-pay | Source: Home / Self Care | Attending: Internal Medicine

## 2015-03-17 HISTORY — PX: HIP PINNING,CANNULATED: SHX1758

## 2015-03-17 SURGERY — FIXATION, FEMUR, NECK, PERCUTANEOUS, USING SCREW
Anesthesia: General | Site: Hip | Laterality: Right

## 2015-03-17 MED ORDER — ENOXAPARIN SODIUM 30 MG/0.3ML ~~LOC~~ SOLN
30.0000 mg | Freq: Two times a day (BID) | SUBCUTANEOUS | Status: DC
  Administered 2015-03-17 – 2015-03-18 (×2): 30 mg via SUBCUTANEOUS
  Filled 2015-03-17 (×2): qty 0.3

## 2015-03-17 MED ORDER — METOCLOPRAMIDE HCL 5 MG PO TABS: 5.0000 mg | ORAL_TABLET | Freq: Three times a day (TID) | ORAL | Status: DC | PRN

## 2015-03-17 MED ORDER — PHENYLEPHRINE HCL 10 MG/ML IJ SOLN
INTRAMUSCULAR | Status: DC | PRN
  Administered 2015-03-17 (×2): 80 ug via INTRAVENOUS
  Administered 2015-03-17: 40 ug via INTRAVENOUS

## 2015-03-17 MED ORDER — HYDROCODONE-ACETAMINOPHEN 5-325 MG PO TABS: 1.0000 | ORAL_TABLET | Freq: Four times a day (QID) | ORAL | Status: DC | PRN

## 2015-03-17 MED ORDER — POTASSIUM CHLORIDE IN NACL 20-0.45 MEQ/L-% IV SOLN
INTRAVENOUS | Status: DC
Start: 2015-03-17 — End: 2015-03-18
  Administered 2015-03-17: 100 mL/h via INTRAVENOUS
  Administered 2015-03-17: 13:00:00 via INTRAVENOUS
  Filled 2015-03-17 (×4): qty 1000

## 2015-03-17 MED ORDER — SUCCINYLCHOLINE CHLORIDE 20 MG/ML IJ SOLN
INTRAMUSCULAR | Status: DC | PRN
  Administered 2015-03-17: 80 mg via INTRAVENOUS

## 2015-03-17 MED ORDER — DOCUSATE SODIUM 100 MG PO CAPS
100.0000 mg | ORAL_CAPSULE | Freq: Two times a day (BID) | ORAL | Status: DC
  Administered 2015-03-17 – 2015-03-18 (×2): 100 mg via ORAL
  Filled 2015-03-17 (×2): qty 1

## 2015-03-17 MED ORDER — METOCLOPRAMIDE HCL 5 MG/ML IJ SOLN: 5.0000 mg | Freq: Three times a day (TID) | INTRAMUSCULAR | Status: DC | PRN

## 2015-03-17 MED ORDER — OXYCODONE HCL 5 MG PO TABS
5.0000 mg | ORAL_TABLET | ORAL | Status: DC | PRN
  Administered 2015-03-17 – 2015-03-18 (×4): 10 mg via ORAL
  Filled 2015-03-17 (×5): qty 2

## 2015-03-17 MED ORDER — HYDROMORPHONE HCL 1 MG/ML IJ SOLN: 1.0000 mg | INTRAMUSCULAR | Status: DC | PRN

## 2015-03-17 MED ORDER — DIPHENHYDRAMINE HCL 50 MG/ML IJ SOLN
INTRAMUSCULAR | Status: DC | PRN
  Administered 2015-03-17: 12.5 mg via INTRAVENOUS

## 2015-03-17 MED ORDER — FENTANYL CITRATE (PF) 250 MCG/5ML IJ SOLN
INTRAMUSCULAR | Status: AC
  Filled 2015-03-17: qty 5

## 2015-03-17 MED ORDER — LACTATED RINGERS IV SOLN
INTRAVENOUS | Status: DC | PRN
  Administered 2015-03-17: 07:00:00 via INTRAVENOUS

## 2015-03-17 MED ORDER — PROPOFOL 10 MG/ML IV BOLUS
INTRAVENOUS | Status: DC | PRN
  Administered 2015-03-17: 30 mg via INTRAVENOUS
  Administered 2015-03-17: 170 mg via INTRAVENOUS

## 2015-03-17 MED ORDER — HYDRALAZINE HCL 20 MG/ML IJ SOLN: 5.0000 mg | Freq: Four times a day (QID) | INTRAMUSCULAR | Status: DC | PRN

## 2015-03-17 MED ORDER — ASPIRIN 325 MG PO TABS: 325.0000 mg | ORAL_TABLET | Freq: Every day | ORAL | Status: DC

## 2015-03-17 MED ORDER — LIDOCAINE HCL (CARDIAC) 20 MG/ML IV SOLN
INTRAVENOUS | Status: DC | PRN
  Administered 2015-03-17: 100 mg via INTRATRACHEAL

## 2015-03-17 MED ORDER — MEPERIDINE HCL 25 MG/ML IJ SOLN: 6.2500 mg | INTRAMUSCULAR | Status: DC | PRN

## 2015-03-17 MED ORDER — ONDANSETRON HCL 4 MG PO TABS: 4.0000 mg | ORAL_TABLET | Freq: Three times a day (TID) | ORAL | Status: DC | PRN

## 2015-03-17 MED ORDER — METHOCARBAMOL 500 MG PO TABS
500.0000 mg | ORAL_TABLET | Freq: Four times a day (QID) | ORAL | Status: DC
Start: 2015-03-17 — End: 2016-01-08

## 2015-03-17 MED ORDER — PROMETHAZINE HCL 25 MG/ML IJ SOLN
6.2500 mg | INTRAMUSCULAR | Status: DC | PRN
Start: 2015-03-17 — End: 2015-03-17

## 2015-03-17 MED ORDER — HYDROMORPHONE HCL 1 MG/ML IJ SOLN
0.2500 mg | INTRAMUSCULAR | Status: DC | PRN
  Administered 2015-03-17 (×4): 0.25 mg via INTRAVENOUS

## 2015-03-17 MED ORDER — ONDANSETRON HCL 4 MG/2ML IJ SOLN
INTRAMUSCULAR | Status: DC | PRN
  Administered 2015-03-17: 4 mg via INTRAVENOUS

## 2015-03-17 MED ORDER — 0.9 % SODIUM CHLORIDE (POUR BTL) OPTIME
TOPICAL | Status: DC | PRN
  Administered 2015-03-17: 1000 mL

## 2015-03-17 MED ORDER — PROPOFOL 10 MG/ML IV BOLUS
INTRAVENOUS | Status: AC
  Filled 2015-03-17: qty 20

## 2015-03-17 MED ORDER — HYDROMORPHONE HCL 1 MG/ML IJ SOLN
INTRAMUSCULAR | Status: AC
  Administered 2015-03-17: 0.25 mg via INTRAVENOUS
  Filled 2015-03-17: qty 1

## 2015-03-17 MED ORDER — CEFAZOLIN SODIUM 1-5 GM-% IV SOLN
1.0000 g | Freq: Four times a day (QID) | INTRAVENOUS | Status: AC
  Administered 2015-03-17 – 2015-03-18 (×3): 1 g via INTRAVENOUS
  Filled 2015-03-17 (×3): qty 50

## 2015-03-17 MED ORDER — MIDAZOLAM HCL 2 MG/2ML IJ SOLN
INTRAMUSCULAR | Status: AC
  Filled 2015-03-17: qty 2

## 2015-03-17 MED ORDER — FENTANYL CITRATE (PF) 100 MCG/2ML IJ SOLN
INTRAMUSCULAR | Status: DC | PRN
  Administered 2015-03-17: 50 ug via INTRAVENOUS
  Administered 2015-03-17: 100 ug via INTRAVENOUS
  Administered 2015-03-17: 50 ug via INTRAVENOUS

## 2015-03-17 SURGICAL SUPPLY — 38 items
BIT DRILL 4.9 CANNULATED (BIT) ×1
BIT DRILL CANN QC 4.9 LRG (BIT) ×1 IMPLANT
BNDG COHESIVE 4X5 TAN STRL (GAUZE/BANDAGES/DRESSINGS) ×2 IMPLANT
BNDG COHESIVE 6X5 TAN STRL LF (GAUZE/BANDAGES/DRESSINGS) ×6 IMPLANT
BNDG GAUZE ELAST 4 BULKY (GAUZE/BANDAGES/DRESSINGS) ×2 IMPLANT
COVER PERINEAL POST (MISCELLANEOUS) ×2 IMPLANT
COVER SURGICAL LIGHT HANDLE (MISCELLANEOUS) ×2 IMPLANT
DRAPE STERI IOBAN 125X83 (DRAPES) ×2 IMPLANT
DRILL BIT CANNULATED 4.9 (BIT) ×1
DRSG MEPILEX BORDER 4X4 (GAUZE/BANDAGES/DRESSINGS) ×2 IMPLANT
DURAPREP 26ML APPLICATOR (WOUND CARE) ×2 IMPLANT
ELECT REM PT RETURN 9FT ADLT (ELECTROSURGICAL) ×2
ELECTRODE REM PT RTRN 9FT ADLT (ELECTROSURGICAL) ×1 IMPLANT
GLOVE BIO SURGEON STRL SZ7 (GLOVE) ×2 IMPLANT
GLOVE BIO SURGEON STRL SZ7.5 (GLOVE) ×6 IMPLANT
GLOVE BIOGEL PI IND STRL 7.0 (GLOVE) ×1 IMPLANT
GLOVE BIOGEL PI IND STRL 8 (GLOVE) ×1 IMPLANT
GLOVE BIOGEL PI INDICATOR 7.0 (GLOVE) ×1
GLOVE BIOGEL PI INDICATOR 8 (GLOVE) ×1
GOWN STRL REUS W/ TWL LRG LVL3 (GOWN DISPOSABLE) ×1 IMPLANT
GOWN STRL REUS W/TWL LRG LVL3 (GOWN DISPOSABLE) ×1
GUIDEWIRE ASNIS 3.2 NONCAL (WIRE) ×6 IMPLANT
KIT BASIN OR (CUSTOM PROCEDURE TRAY) ×2 IMPLANT
KIT ROOM TURNOVER OR (KITS) ×2 IMPLANT
MANIFOLD NEPTUNE II (INSTRUMENTS) ×2 IMPLANT
NS IRRIG 1000ML POUR BTL (IV SOLUTION) ×2 IMPLANT
PACK GENERAL/GYN (CUSTOM PROCEDURE TRAY) ×2 IMPLANT
PAD ARMBOARD 7.5X6 YLW CONV (MISCELLANEOUS) ×4 IMPLANT
SCREW ASNIS 85MM (Screw) ×4 IMPLANT
SCREW ASNIS 90MM (Screw) ×2 IMPLANT
SCREW ASNIS 95MM (Screw) ×2 IMPLANT
STRIP CLOSURE SKIN 1/2X4 (GAUZE/BANDAGES/DRESSINGS) ×2 IMPLANT
SUT MON AB 2-0 CT1 36 (SUTURE) ×2 IMPLANT
SUT VIC AB 0 CT1 27 (SUTURE) ×1
SUT VIC AB 0 CT1 27XBRD ANBCTR (SUTURE) ×1 IMPLANT
TOWEL OR 17X24 6PK STRL BLUE (TOWEL DISPOSABLE) ×2 IMPLANT
TOWEL OR 17X26 10 PK STRL BLUE (TOWEL DISPOSABLE) ×2 IMPLANT
WATER STERILE IRR 1000ML POUR (IV SOLUTION) IMPLANT

## 2015-03-17 NOTE — Discharge Instructions (Signed)
Keep dressings clean and dry till follow up  Weight bearing as tolerated in the R leg  ASA 325mg  daily for DVT prophylaxis for 30 days

## 2015-03-17 NOTE — Transfer of Care (Signed)
Immediate Anesthesia Transfer of Care Note  Patient: Sheryl Foster  Procedure(s) Performed: Procedure(s): FEMORAL NECK PINNING (Right)  Patient Location: PACU  Anesthesia Type:General  Level of Consciousness: awake, patient cooperative and responds to stimulation  Airway & Oxygen Therapy: Patient Spontanous Breathing and Patient connected to nasal cannula oxygen  Post-op Assessment: Report given to RN, Post -op Vital signs reviewed and stable and Patient moving all extremities X 4  Post vital signs: Reviewed and stable  Last Vitals:  Filed Vitals:   03/16/15 2153 03/17/15 0542  BP: 134/77 149/80  Pulse: 70 82  Temp: 37.3 C 37.1 C  Resp: 17 16    Complications: No apparent anesthesia complications

## 2015-03-17 NOTE — Op Note (Signed)
03/16/2015 - 03/17/2015  8:23 AM  PATIENT:  Sheryl Foster    PRE-OPERATIVE DIAGNOSIS:  closed fx of neck right femur  POST-OPERATIVE DIAGNOSIS:  Same  PROCEDURE:  FEMORAL NECK PINNING  SURGEON:  Lucina Betty D, MD  ASSISTANT: Lovett Calender, PA-C, She was present and scrubbed throughout the case, critical for completion in a timely fashion, and for retraction, instrumentation, and closure.   ANESTHESIA:   General  PREOPERATIVE INDICATIONS:  Sheryl Foster is a  69 y.o. female who fell and was found to have a diagnosis of closed fx of neck right femur who elected for surgical management.    The risks benefits and alternatives were discussed with the patient preoperatively including but not limited to the risks of infection, bleeding, nerve injury, cardiopulmonary complications, blood clots, malunion, nonunion, avascular necrosis, the need for revision surgery, the potential for conversion to hemiarthroplasty, among others, and the patient was willing to proceed.  OPERATIVE IMPLANTS: 6.5 mm cannulated screws x3  OPERATIVE FINDINGS: Clinical osteoporosis with weak bone, proximal femur  OPERATIVE PROCEDURE: The patient was brought to the operating room and placed in supine position. IV antibiotics were given. General anesthesia administered. Foley was also given. The patient was placed on the fracture table. The operative extremity was positioned, without any significant reduction maneuver and was prepped and draped in usual sterile fashion.  Time out was performed.  Small incisions were made distal to the greater trochanter, and 3 guidewires were introduced Into an inverted triangle configuration. The lengths were measured. The reduction was slightly valgus, and near-anatomic. I opened the cortex with a cannulated drill, and then placed the screws into position. Satisfactory fixation was achieved. I sequentially tightened the screws by hand.  I performed a live  fluoroscopic exam and no screw penetrance was noted. All threads crossed the fracture site.   The wounds were irrigated copiously, and repaired with Vicryl with Steri-Strips and sterile gauze. There no complications and the patient tolerated the procedure well.  The patient will be weightbearing as tolerated, VTE prophylaxis will be: ambulation and ASA/scd/tedhose   This note was generated using a template and dragon dictation system. In light of that, I have reviewed the note and all aspects of it are applicable to this case. Any dictation errors are due to the computerized dictation system.

## 2015-03-17 NOTE — Anesthesia Procedure Notes (Signed)
Procedure Name: Intubation Date/Time: 03/17/2015 7:37 AM Performed by: Tressia Miners LEFFEW Pre-anesthesia Checklist: Patient identified, Patient being monitored, Timeout performed, Emergency Drugs available and Suction available Patient Re-evaluated:Patient Re-evaluated prior to inductionOxygen Delivery Method: Circle System Utilized Preoxygenation: Pre-oxygenation with 100% oxygen Intubation Type: IV induction Ventilation: Mask ventilation without difficulty Laryngoscope Size: Mac and 3 Grade View: Grade I Tube type: Oral Tube size: 7.0 mm Number of attempts: 1 Airway Equipment and Method: Stylet Placement Confirmation: ETT inserted through vocal cords under direct vision,  positive ETCO2 and breath sounds checked- equal and bilateral Secured at: 22 cm Tube secured with: Tape Dental Injury: Teeth and Oropharynx as per pre-operative assessment

## 2015-03-17 NOTE — Anesthesia Postprocedure Evaluation (Signed)
Anesthesia Post Note  Patient: Sheryl Foster  Procedure(s) Performed: Procedure(s) (LRB): FEMORAL NECK PINNING (Right)  Patient location during evaluation: PACU Anesthesia Type: General Level of consciousness: sedated and patient cooperative Pain management: pain level controlled Vital Signs Assessment: post-procedure vital signs reviewed and stable Respiratory status: spontaneous breathing Cardiovascular status: stable Anesthetic complications: no    Last Vitals:  Filed Vitals:   03/17/15 0954 03/17/15 1000  BP: 155/79   Pulse: 75 81  Temp:  36.8 C  Resp: 17 19    Last Pain:  Filed Vitals:   03/17/15 1002  PainSc: [place]

## 2015-03-17 NOTE — Consult Note (Signed)
ANTICOAGULATION CONSULT NOTE - Initial Consult  Pharmacy Consult for Lovenox Indication: VTE prophylaxis  Allergies  Allergen Reactions  . Tobramycin Swelling    Patient Measurements: Height: 5\' 6"  (167.6 cm) Weight: 160 lb (72.576 kg) IBW/kg (Calculated) : 59.3  Vital Signs: Temp: 98.1 F (36.7 C) (12/18 1340) Temp Source: Oral (12/18 1340) BP: 153/79 mmHg (12/18 1340) Pulse Rate: 83 (12/18 1340)  Labs:  Recent Labs  03/16/15 1100 03/16/15 1127  HGB 13.9 15.3*  HCT 42.6 45.0  PLT 210  --   CREATININE 0.78 0.80    Estimated Creatinine Clearance: 66.7 mL/min (by C-G formula based on Cr of 0.8).   Medical History: Past Medical History  Diagnosis Date  . Hypothyroidism   . Rheumatoid arthritis ([place])   . Sjogren's disease ([place])     Medications:  Scheduled:  .  ceFAZolin (ANCEF) IV  1 g Intravenous Q6H  . cholecalciferol  400 Units Oral Daily  . docusate sodium  100 mg Oral BID  . enoxaparin (LOVENOX) injection  30 mg Subcutaneous Q12H  . folic acid  1 mg Oral Daily  . ibuprofen  800 mg Oral Once  . levothyroxine  88 mcg Oral Daily  . oxyCODONE  5 mg Oral Once    Assessment: 9 yoF s/p surgery for closed fx of neck right femur. Pharmacy consulted to start Lovenox VTE prophylaxis. Per MD okay to start treatment this evening. Hgb 15.3, plt wnl  Goal of Therapy:  Prevention of VTE Monitor platelets by anticoagulation protocol: Yes   Plan:  Lovenox 30mg  Q12hr  Pharmacy to sign off, contact if need for further assistance, Thank you!  Sheryl Foster, PharmD Clinical Pharmacist- Resident Pager: [phone]  Sheryl Foster 03/17/2015,2:09 PM

## 2015-03-17 NOTE — Anesthesia Preprocedure Evaluation (Addendum)
Anesthesia Evaluation  Patient identified by MRN, date of birth, ID band Patient awake    Reviewed: Allergy & Precautions, NPO status , Patient's Chart, lab work & pertinent test results  Airway Mallampati: I  TM Distance: >3 FB Neck ROM: Full    Dental no notable dental hx.    Pulmonary neg pulmonary ROS,    Pulmonary exam normal breath sounds clear to auscultation       Cardiovascular negative cardio ROS Normal cardiovascular exam Rhythm:Regular Rate:Normal + Systolic murmurs    Neuro/Psych negative neurological ROS  negative psych ROS   GI/Hepatic negative GI ROS, Neg liver ROS,   Endo/Other  Hypothyroidism   Renal/GU negative Renal ROS     Musculoskeletal  (+) Arthritis ,   Abdominal   Peds  Hematology negative hematology ROS (+)   Anesthesia Other Findings   Reproductive/Obstetrics negative OB ROS                            Anesthesia Physical Anesthesia Plan  ASA: II  Anesthesia Plan: General   Post-op Pain Management:    Induction: Intravenous  Airway Management Planned: Oral ETT  Additional Equipment:   Intra-op Plan:   Post-operative Plan: Extubation in OR  Informed Consent: I have reviewed the patients History and Physical, chart, labs and discussed the procedure including the risks, benefits and alternatives for the proposed anesthesia with the patient or authorized representative who has indicated his/her understanding and acceptance.   Dental advisory given  Plan Discussed with: CRNA  Anesthesia Plan Comments:        Anesthesia Quick Evaluation

## 2015-03-17 NOTE — Progress Notes (Signed)
[facility] PROGRESS NOTE  Sheryl Foster [id number] DOB: 04-07-1945 DOA: 03/16/2015 PCP: Horatio Pel, MD  Length of stay: 1   Assessment/Plan: Active Problems:   Right femoral fracture (HCC)   Hypothyroid   Femoral neck fracture, right, closed, initial encounter   Femur fracture ([place])   Rheumatoid arthritis ([place])   Sjogren's disease ([place])   69 y.o. female fell on right hip leaving farmer's market today. She dropped some eggs on the ground and was trying to get them when fall occurred. No prior history of falls. Patient lives alone. She is physically active, walks 2 miles a day. No other complaints  Assessment and plan Femoral neck fracture, right, closed, initial encounter. Orthopedics called by EDP. Status post FEMORAL NECK PINNING-by Dr. Percell Miller Provide pain control, ambulate for orthopedic recommendations, Start Lovenox for DVT prophylaxis, defer to orthopedics to provide further recommendations for DVT prophylaxis postop, at the time of discharge  Hypertension-start prn hydralazine  Hypothyroidism. -continue home Synthroid.  Rheumatoid arthritis, on chronic Methotrexate. Next dose due Thursday.   Sjogrens disease. -Continue home eye drops / vitamins   DVT prophylaxsis Lovenox  Code Status:      Code Status Orders        Start     Ordered   03/17/15 1030  Full code   Continuous     03/17/15 1029    Advance Directive Documentation        Most Recent Value   Type of Advance Directive  Healthcare Power of Attorney, Living will   Pre-existing out of facility DNR order (yellow form or pink MOST form)     "MOST" Form in Place?        Family Communication: Discussed in detail with the patient, all imaging results, lab results explained to the patient   Disposition Plan:  As above      Consultants:   orthopedic   procedures  None  Antibiotics: Anti-infectives    Start     Dose/Rate Route Frequency Ordered Stop    03/17/15 1300  ceFAZolin (ANCEF) IVPB 1 g/50 mL premix     1 g 100 mL/hr over 30 Minutes Intravenous Every 6 hours 03/17/15 1029 03/18/15 0659   03/17/15 0600  ceFAZolin (ANCEF) IVPB 2 g/50 mL premix     2 g 100 mL/hr over 30 Minutes Intravenous To [facility] 03/16/15 1514 03/17/15 0755         HPI/Subjective: patient seen postoperatively, stable  Objective: Filed Vitals:   03/17/15 0935 03/17/15 0937 03/17/15 0954 03/17/15 1000  BP:  152/81 155/79   Pulse: 70 76 75 81  Temp:    98.3 F (36.8 C)  TempSrc:      Resp: 19 20 17 19   Height:      Weight:      SpO2: 100% 100% 100% 98%    Intake/Output Summary (Last 24 hours) at 03/17/15 1324 Last data filed at 03/17/15 0855  Gross per 24 hour  Intake    790 ml  Output     15 ml  Net    775 ml    Exam:  General: No acute respiratory distress Lungs: Clear to auscultation bilaterally without wheezes or crackles Cardiovascular: Regular rate and rhythm without murmur gallop or rub normal S1 and S2 Abdomen: Nontender, nondistended, soft, bowel sounds positive, no rebound, no ascites, no appreciable mass Extremities: No significant cyanosis, clubbing, or edema bilateral lower extremities     Data Review  Micro Results Recent Results (from the past 240 hour(s))  MRSA PCR Screening     Status: None   Collection Time: 03/16/15  9:28 PM  Result Value Ref Range Status   MRSA by PCR NEGATIVE NEGATIVE Final    Comment:        The GeneXpert MRSA Assay (FDA approved for NASAL specimens only), is one component of a comprehensive MRSA colonization surveillance program. It is not intended to diagnose MRSA infection nor to guide or monitor treatment for MRSA infections.     Radiology Reports Dg Chest Port 1 View  03/16/2015  CLINICAL DATA:  Preoperative chest evaluation, hip fracture EXAM: PORTABLE CHEST - 1 VIEW COMPARISON:  None. FINDINGS: The heart size and mediastinal contours are within normal limits. Both  lungs are clear. The visualized skeletal structures are unremarkable. IMPRESSION: No active disease. Electronically Signed   By: Inez Catalina M.D.   On: 03/16/2015 11:30   Dg Hip Unilat With Pelvis 1v Right  03/17/2015  CLINICAL DATA:  Post RIGHT femoral neck pinning EXAM: DG HIP (WITH OR WITHOUT PELVIS) 1V RIGHT COMPARISON:  03/17/2015 intraoperative images. FINDINGS: Three cannulated screws present in the proximal RIGHT femur post ORIF. Hip joint spaces preserved. SI joints symmetric. No additional fracture identified. IMPRESSION: Post pinning of RIGHT femoral neck fracture. Electronically Signed   By: Lavonia Dana M.D.   On: 03/17/2015 11:57   Dg Hip Operative Unilat With Pelvis Right  03/17/2015  CLINICAL DATA:  RIGHT hip fracture pinning EXAM: OPERATIVE RIGHT HIP (WITH PELVIS IF PERFORMED) 2VIEWS TECHNIQUE: Fluoroscopic spot image(s) were submitted for interpretation post-operatively. COMPARISON:  03/16/2015 FLUOROSCOPY TIME:  1 minutes 14 seconds Images obtained: 2 FINDINGS: Two views demonstrate placement of 3 cannulated screws in the proximal RIGHT femur across the previously identified subcapital fracture of the RIGHT femoral neck. Diffuse osseous demineralization. No dislocation or additional fracture identified. IMPRESSION: Post pinning of RIGHT femoral neck fracture. Electronically Signed   By: Lavonia Dana M.D.   On: 03/17/2015 10:28   Dg Hip Unilat  With Pelvis 2-3 Views Right  03/16/2015  CLINICAL DATA:  Recent fall with right hip pain, initial encounter EXAM: DG HIP (WITH OR WITHOUT PELVIS) 2-3V RIGHT COMPARISON:  None. FINDINGS: There is a mildly displaced subcapital right femoral neck fracture with impaction at the fracture site. No other abnormality is noted. No soft tissue changes are seen. IMPRESSION: Subcapital femoral neck fracture on the right Electronically Signed   By: Inez Catalina M.D.   On: 03/16/2015 10:45   Dg Femur, Min 2 Views Right  03/16/2015  CLINICAL DATA:  Recent  fall with right leg pain EXAM: RIGHT FEMUR 2 VIEWS COMPARISON:  None. FINDINGS: Subcapital femoral neck fracture with mild impaction at the fracture site. The remainder the femur is within normal limits. IMPRESSION: Subcapital right femoral neck fracture. Electronically Signed   By: Inez Catalina M.D.   On: 03/16/2015 10:46     CBC  Recent Labs Lab 03/16/15 1100 03/16/15 1127  WBC 9.3  --   HGB 13.9 15.3*  HCT 42.6 45.0  PLT 210  --   MCV 98.2  --   MCH 32.0  --   MCHC 32.6  --   RDW 14.2  --   LYMPHSABS 1.4  --   MONOABS 0.4  --   EOSABS 0.0  --   BASOSABS 0.0  --     Chemistries   Recent Labs Lab 03/16/15 1100 03/16/15 1127  NA 141 143  K 4.1 3.9  CL 110 107  CO2 27  --   GLUCOSE 106* 105*  BUN 11 16  CREATININE 0.78 0.80  CALCIUM 9.4  --   AST 29  --   ALT 27  --   ALKPHOS 79  --   BILITOT 0.5  --    ------------------------------------------------------------------------------------------------------------------ estimated creatinine clearance is 66.7 mL/min (by C-G formula based on Cr of 0.8). ------------------------------------------------------------------------------------------------------------------ No results for input(s): HGBA1C in the last 72 hours. ------------------------------------------------------------------------------------------------------------------ No results for input(s): CHOL, HDL, LDLCALC, TRIG, CHOLHDL, LDLDIRECT in the last 72 hours. ------------------------------------------------------------------------------------------------------------------ No results for input(s): TSH, T4TOTAL, T3FREE, THYROIDAB in the last 72 hours.  Invalid input(s): FREET3 ------------------------------------------------------------------------------------------------------------------ No results for input(s): VITAMINB12, FOLATE, FERRITIN, TIBC, IRON, RETICCTPCT in the last 72 hours.  Coagulation profile No results for input(s): INR, PROTIME in the last  168 hours.  No results for input(s): DDIMER in the last 72 hours.  Cardiac Enzymes No results for input(s): CKMB, TROPONINI, MYOGLOBIN in the last 168 hours.  Invalid input(s): CK ------------------------------------------------------------------------------------------------------------------ Invalid input(s): POCBNP   CBG: No results for input(s): GLUCAP in the last 168 hours.     Studies: Dg Chest Port 1 View  03/16/2015  CLINICAL DATA:  Preoperative chest evaluation, hip fracture EXAM: PORTABLE CHEST - 1 VIEW COMPARISON:  None. FINDINGS: The heart size and mediastinal contours are within normal limits. Both lungs are clear. The visualized skeletal structures are unremarkable. IMPRESSION: No active disease. Electronically Signed   By: Inez Catalina M.D.   On: 03/16/2015 11:30   Dg Hip Unilat With Pelvis 1v Right  03/17/2015  CLINICAL DATA:  Post RIGHT femoral neck pinning EXAM: DG HIP (WITH OR WITHOUT PELVIS) 1V RIGHT COMPARISON:  03/17/2015 intraoperative images. FINDINGS: Three cannulated screws present in the proximal RIGHT femur post ORIF. Hip joint spaces preserved. SI joints symmetric. No additional fracture identified. IMPRESSION: Post pinning of RIGHT femoral neck fracture. Electronically Signed   By: Lavonia Dana M.D.   On: 03/17/2015 11:57   Dg Hip Operative Unilat With Pelvis Right  03/17/2015  CLINICAL DATA:  RIGHT hip fracture pinning EXAM: OPERATIVE RIGHT HIP (WITH PELVIS IF PERFORMED) 2VIEWS TECHNIQUE: Fluoroscopic spot image(s) were submitted for interpretation post-operatively. COMPARISON:  03/16/2015 FLUOROSCOPY TIME:  1 minutes 14 seconds Images obtained: 2 FINDINGS: Two views demonstrate placement of 3 cannulated screws in the proximal RIGHT femur across the previously identified subcapital fracture of the RIGHT femoral neck. Diffuse osseous demineralization. No dislocation or additional fracture identified. IMPRESSION: Post pinning of RIGHT femoral neck fracture.  Electronically Signed   By: Lavonia Dana M.D.   On: 03/17/2015 10:28   Dg Hip Unilat  With Pelvis 2-3 Views Right  03/16/2015  CLINICAL DATA:  Recent fall with right hip pain, initial encounter EXAM: DG HIP (WITH OR WITHOUT PELVIS) 2-3V RIGHT COMPARISON:  None. FINDINGS: There is a mildly displaced subcapital right femoral neck fracture with impaction at the fracture site. No other abnormality is noted. No soft tissue changes are seen. IMPRESSION: Subcapital femoral neck fracture on the right Electronically Signed   By: Inez Catalina M.D.   On: 03/16/2015 10:45   Dg Femur, Min 2 Views Right  03/16/2015  CLINICAL DATA:  Recent fall with right leg pain EXAM: RIGHT FEMUR 2 VIEWS COMPARISON:  None. FINDINGS: Subcapital femoral neck fracture with mild impaction at the fracture site. The remainder the femur is within normal limits. IMPRESSION: Subcapital right femoral neck fracture. Electronically Signed   By: Linus Mako.D.  On: 03/16/2015 10:46      No results found for: HGBA1C Lab Results  Component Value Date   CREATININE 0.80 03/16/2015       Scheduled Meds: .  ceFAZolin (ANCEF) IV  1 g Intravenous Q6H  . cholecalciferol  400 Units Oral Daily  . docusate sodium  100 mg Oral BID  . folic acid  1 mg Oral Daily  . ibuprofen  800 mg Oral Once  . levothyroxine  88 mcg Oral Daily  . oxyCODONE  5 mg Oral Once   Continuous Infusions: . 0.45 % NaCl with KCl 20 mEq / L      Active Problems:   Right femoral fracture (HCC)   Hypothyroid   Femoral neck fracture, right, closed, initial encounter   Femur fracture (HCC)   Rheumatoid arthritis ([place])   Sjogren's disease ([place])    Time spent: 45 minutes   [facility] Pager [phone]. If 7PM-7AM, please contact night-coverage at [email or web address], password [facility] 03/17/2015, 1:24 PM  LOS: 1 day

## 2015-03-18 ENCOUNTER — Encounter (HOSPITAL_COMMUNITY): Payer: Self-pay | Admitting: Orthopedic Surgery

## 2015-03-18 LAB — COMPREHENSIVE METABOLIC PANEL
ALT: 17 U/L (ref 14–54)
AST: 23 U/L (ref 15–41)
Albumin: 2.7 g/dL — ABNORMAL LOW (ref 3.5–5.0)
Alkaline Phosphatase: 59 U/L (ref 38–126)
Anion gap: 6 (ref 5–15)
BUN: 8 mg/dL (ref 6–20)
CHLORIDE: 108 mmol/L (ref 101–111)
CO2: 25 mmol/L (ref 22–32)
CREATININE: 0.81 mg/dL (ref 0.44–1.00)
Calcium: 8.3 mg/dL — ABNORMAL LOW (ref 8.9–10.3)
GFR calc Af Amer: >60 mL/min (ref >60)
GFR calc non Af Amer: >60 mL/min (ref >60)
GLUCOSE: 128 mg/dL — AB (ref 65–99)
Potassium: 4.2 mmol/L (ref 3.5–5.1)
SODIUM: 139 mmol/L (ref 135–145)
Total Bilirubin: 0.7 mg/dL (ref 0.3–1.2)
Total Protein: 5.3 g/dL — ABNORMAL LOW (ref 6.5–8.1)

## 2015-03-18 LAB — CBC
HCT: 36.5 % (ref 36.0–46.0)
Hemoglobin: 12.2 g/dL (ref 12.0–15.0)
MCH: 33 pg (ref 26.0–34.0)
MCHC: 33.4 g/dL (ref 30.0–36.0)
MCV: 98.6 fL (ref 78.0–100.0)
PLATELETS: 166 10*3/uL (ref 150–400)
RBC: 3.7 MIL/uL — AB (ref 3.87–5.11)
RDW: 14.4 % (ref 11.5–15.5)
WBC: 7.5 10*3/uL (ref 4.0–10.5)

## 2015-03-18 MED ORDER — DOCUSATE SODIUM 100 MG PO CAPS: 100.0000 mg | ORAL_CAPSULE | Freq: Two times a day (BID) | ORAL | Status: DC

## 2015-03-18 NOTE — Care Management (Signed)
Utilization review completed. Takoda Siedlecki, RN Case Manager [phone]. 

## 2015-03-18 NOTE — Discharge Summary (Signed)
Physician Discharge Summary  MARIPOSA SHORES MRN: [id number] DOB/AGE: [birth year] 70 y.o.  PCP: Horatio Pel, MD   Admit date: 03/16/2015 Discharge date: 03/18/2015  Discharge Diagnoses:     Active Problems:   Right femoral fracture (HCC)   Hypothyroid   Femoral neck fracture, right, closed, initial encounter   Femur fracture (HCC)   Rheumatoid arthritis (HCC)   Sjogren's disease ([place])    Follow-up recommendations Follow-up with PCP in 3-5 days , including all  additional recommended appointments as below Follow-up CBC, CMP in 3-5 days      Medication List    TAKE these medications        aspirin 325 MG tablet  Take 1 tablet (325 mg total) by mouth daily.     CALCIUM-VITAMIN D PO  Take 1 capsule by mouth daily.     docusate sodium 100 MG capsule  Commonly known as:  COLACE  Take 1 capsule (100 mg total) by mouth 2 (two) times daily.     FISH OIL PO  Take 1 capsule by mouth 2 (two) times daily.     folic acid 1 MG tablet  Commonly known as:  FOLVITE  Take 1 tablet by mouth daily.     HYDROcodone-acetaminophen 5-325 MG tablet  Commonly known as:  NORCO  Take 1-2 tablets by mouth every 6 (six) hours as needed for moderate pain.     levothyroxine 88 MCG tablet  Commonly known as:  SYNTHROID, LEVOTHROID  Take 1 tablet by mouth daily.     meloxicam 15 MG tablet  Commonly known as:  MOBIC  Take 1 tablet by mouth as needed. Patient takes 1/2 tablet 4 times per week     methocarbamol 500 MG tablet  Commonly known as:  ROBAXIN  Take 1 tablet (500 mg total) by mouth 4 (four) times daily.     methotrexate 2.5 MG tablet  Commonly known as:  RHEUMATREX  Take 3 tablets by mouth once a week.     ondansetron 4 MG tablet  Commonly known as:  ZOFRAN  Take 1 tablet (4 mg total) by mouth every 8 (eight) hours as needed for nausea or vomiting.     VITAMIN D PO  Take 1 capsule by mouth daily.         Discharge Condition: stable, ambulatory per  PT/ortho  Discharge Instructions       Discharge Instructions    Diet - low sodium heart healthy    Complete by:  As directed      Increase activity slowly    Complete by:  As directed      Weight bearing as tolerated    Complete by:  As directed   Laterality:  right  Extremity:  Lower           Allergies  Allergen Reactions  . Tobramycin Swelling      Disposition: Final discharge disposition not confirmed   Consults: Renette Butters, MD     Significant Diagnostic Studies:  Dg Chest Port 1 View  03/16/2015  CLINICAL DATA:  Preoperative chest evaluation, hip fracture EXAM: PORTABLE CHEST - 1 VIEW COMPARISON:  None. FINDINGS: The heart size and mediastinal contours are within normal limits. Both lungs are clear. The visualized skeletal structures are unremarkable. IMPRESSION: No active disease. Electronically Signed   By: Inez Catalina M.D.   On: 03/16/2015 11:30   Dg Hip Unilat With Pelvis 1v Right  03/17/2015  CLINICAL DATA:  Post RIGHT femoral neck pinning  EXAM: DG HIP (WITH OR WITHOUT PELVIS) 1V RIGHT COMPARISON:  03/17/2015 intraoperative images. FINDINGS: Three cannulated screws present in the proximal RIGHT femur post ORIF. Hip joint spaces preserved. SI joints symmetric. No additional fracture identified. IMPRESSION: Post pinning of RIGHT femoral neck fracture. Electronically Signed   By: Lavonia Dana M.D.   On: 03/17/2015 11:57   Dg Hip Operative Unilat With Pelvis Right  03/17/2015  CLINICAL DATA:  RIGHT hip fracture pinning EXAM: OPERATIVE RIGHT HIP (WITH PELVIS IF PERFORMED) 2VIEWS TECHNIQUE: Fluoroscopic spot image(s) were submitted for interpretation post-operatively. COMPARISON:  03/16/2015 FLUOROSCOPY TIME:  1 minutes 14 seconds Images obtained: 2 FINDINGS: Two views demonstrate placement of 3 cannulated screws in the proximal RIGHT femur across the previously identified subcapital fracture of the RIGHT femoral neck. Diffuse osseous demineralization. No  dislocation or additional fracture identified. IMPRESSION: Post pinning of RIGHT femoral neck fracture. Electronically Signed   By: Lavonia Dana M.D.   On: 03/17/2015 10:28   Dg Hip Unilat  With Pelvis 2-3 Views Right  03/16/2015  CLINICAL DATA:  Recent fall with right hip pain, initial encounter EXAM: DG HIP (WITH OR WITHOUT PELVIS) 2-3V RIGHT COMPARISON:  None. FINDINGS: There is a mildly displaced subcapital right femoral neck fracture with impaction at the fracture site. No other abnormality is noted. No soft tissue changes are seen. IMPRESSION: Subcapital femoral neck fracture on the right Electronically Signed   By: Inez Catalina M.D.   On: 03/16/2015 10:45   Dg Femur, Min 2 Views Right  03/16/2015  CLINICAL DATA:  Recent fall with right leg pain EXAM: RIGHT FEMUR 2 VIEWS COMPARISON:  None. FINDINGS: Subcapital femoral neck fracture with mild impaction at the fracture site. The remainder the femur is within normal limits. IMPRESSION: Subcapital right femoral neck fracture. Electronically Signed   By: Inez Catalina M.D.   On: 03/16/2015 10:46        Filed Weights   03/16/15 1320  Weight: 72.576 kg (160 lb)     Microbiology: Recent Results (from the past 240 hour(s))  MRSA PCR Screening     Status: None   Collection Time: 03/16/15  9:28 PM  Result Value Ref Range Status   MRSA by PCR NEGATIVE NEGATIVE Final    Comment:        The GeneXpert MRSA Assay (FDA approved for NASAL specimens only), is one component of a comprehensive MRSA colonization surveillance program. It is not intended to diagnose MRSA infection nor to guide or monitor treatment for MRSA infections.        Blood Culture No results found for: SDES, SPECREQUEST, CULT, REPTSTATUS    Labs: Results for orders placed or performed during the hospital encounter of 03/16/15 (from the past 48 hour(s))  CBC with Differential     Status: None   Collection Time: 03/16/15 11:00 AM  Result Value Ref Range   WBC 9.3  4.0 - 10.5 K/uL   RBC 4.34 3.87 - 5.11 MIL/uL   Hemoglobin 13.9 12.0 - 15.0 g/dL   HCT 42.6 36.0 - 46.0 %   MCV 98.2 78.0 - 100.0 fL   MCH 32.0 26.0 - 34.0 pg   MCHC 32.6 30.0 - 36.0 g/dL   RDW 14.2 11.5 - 15.5 %   Platelets 210 150 - 400 K/uL   Neutrophils Relative % 81 %   Neutro Abs 7.5 1.7 - 7.7 K/uL   Lymphocytes Relative 15 %   Lymphs Abs 1.4 0.7 - 4.0 K/uL   Monocytes Relative  4 %   Monocytes Absolute 0.4 0.1 - 1.0 K/uL   Eosinophils Relative 0 %   Eosinophils Absolute 0.0 0.0 - 0.7 K/uL   Basophils Relative 0 %   Basophils Absolute 0.0 0.0 - 0.1 K/uL  Comprehensive metabolic panel     Status: Abnormal   Collection Time: 03/16/15 11:00 AM  Result Value Ref Range   Sodium 141 135 - 145 mmol/L   Potassium 4.1 3.5 - 5.1 mmol/L   Chloride 110 101 - 111 mmol/L   CO2 27 22 - 32 mmol/L   Glucose, Bld 106 (H) 65 - 99 mg/dL   BUN 11 6 - 20 mg/dL   Creatinine, Ser 0.78 0.44 - 1.00 mg/dL   Calcium 9.4 8.9 - 10.3 mg/dL   Total Protein 5.8 (L) 6.5 - 8.1 g/dL   Albumin 3.3 (L) 3.5 - 5.0 g/dL   AST 29 15 - 41 U/L   ALT 27 14 - 54 U/L   Alkaline Phosphatase 79 38 - 126 U/L   Total Bilirubin 0.5 0.3 - 1.2 mg/dL   GFR calc non Af Amer >60 >60 mL/min   GFR calc Af Amer >60 >60 mL/min    Comment: (NOTE) The eGFR has been calculated using the CKD EPI equation. This calculation has not been validated in all clinical situations. eGFR's persistently <60 mL/min signify possible Chronic Kidney Disease.    Anion gap 4 (L) 5 - 15  I-Stat Chem 8, ED     Status: Abnormal   Collection Time: 03/16/15 11:27 AM  Result Value Ref Range   Sodium 143 135 - 145 mmol/L   Potassium 3.9 3.5 - 5.1 mmol/L   Chloride 107 101 - 111 mmol/L   BUN 16 6 - 20 mg/dL   Creatinine, Ser 0.80 0.44 - 1.00 mg/dL   Glucose, Bld 105 (H) 65 - 99 mg/dL   Calcium, Ion 1.19 1.13 - 1.30 mmol/L   TCO2 26 0 - 100 mmol/L   Hemoglobin 15.3 (H) 12.0 - 15.0 g/dL   HCT 45.0 36.0 - 46.0 %  Urinalysis, Routine w reflex  microscopic (not at [facility])     Status: None   Collection Time: 03/16/15 12:38 PM  Result Value Ref Range   Color, Urine YELLOW YELLOW   APPearance CLEAR CLEAR   Specific Gravity, Urine 1.009 1.005 - 1.030   pH 6.5 5.0 - 8.0   Glucose, UA NEGATIVE NEGATIVE mg/dL   Hgb urine dipstick NEGATIVE NEGATIVE   Bilirubin Urine NEGATIVE NEGATIVE   Ketones, ur NEGATIVE NEGATIVE mg/dL   Protein, ur NEGATIVE NEGATIVE mg/dL   Nitrite NEGATIVE NEGATIVE   Leukocytes, UA NEGATIVE NEGATIVE    Comment: MICROSCOPIC NOT DONE ON URINES WITH NEGATIVE PROTEIN, BLOOD, LEUKOCYTES, NITRITE, OR GLUCOSE <1000 mg/dL.  MRSA PCR Screening     Status: None   Collection Time: 03/16/15  9:28 PM  Result Value Ref Range   MRSA by PCR NEGATIVE NEGATIVE    Comment:        The GeneXpert MRSA Assay (FDA approved for NASAL specimens only), is one component of a comprehensive MRSA colonization surveillance program. It is not intended to diagnose MRSA infection nor to guide or monitor treatment for MRSA infections.   CBC     Status: Abnormal   Collection Time: 03/18/15  5:52 AM  Result Value Ref Range   WBC 7.5 4.0 - 10.5 K/uL   RBC 3.70 (L) 3.87 - 5.11 MIL/uL   Hemoglobin 12.2 12.0 - 15.0 g/dL  Comment: DELTA CHECK NOTED REPEATED TO VERIFY    HCT 36.5 36.0 - 46.0 %   MCV 98.6 78.0 - 100.0 fL   MCH 33.0 26.0 - 34.0 pg   MCHC 33.4 30.0 - 36.0 g/dL   RDW 14.4 11.5 - 15.5 %   Platelets 166 150 - 400 K/uL  Comprehensive metabolic panel     Status: Abnormal   Collection Time: 03/18/15  5:52 AM  Result Value Ref Range   Sodium 139 135 - 145 mmol/L   Potassium 4.2 3.5 - 5.1 mmol/L   Chloride 108 101 - 111 mmol/L   CO2 25 22 - 32 mmol/L   Glucose, Bld 128 (H) 65 - 99 mg/dL   BUN 8 6 - 20 mg/dL   Creatinine, Ser 0.81 0.44 - 1.00 mg/dL   Calcium 8.3 (L) 8.9 - 10.3 mg/dL   Total Protein 5.3 (L) 6.5 - 8.1 g/dL   Albumin 2.7 (L) 3.5 - 5.0 g/dL   AST 23 15 - 41 U/L   ALT 17 14 - 54 U/L   Alkaline Phosphatase 59 38  - 126 U/L   Total Bilirubin 0.7 0.3 - 1.2 mg/dL   GFR calc non Af Amer >60 >60 mL/min   GFR calc Af Amer >60 >60 mL/min    Comment: (NOTE) The eGFR has been calculated using the CKD EPI equation. This calculation has not been validated in all clinical situations. eGFR's persistently <60 mL/min signify possible Chronic Kidney Disease.    Anion gap 6 5 - 15     Lipid Panel  No results found for: CHOL, TRIG, HDL, CHOLHDL, VLDL, LDLCALC, LDLDIRECT   No results found for: HGBA1C   Lab Results  Component Value Date   CREATININE 0.81 03/18/2015     69 y.o. female fell on right hip leaving farmer's market today. She dropped some eggs on the ground and was trying to get them when fall occurred. No prior history of falls. Patient lives alone. She is physically active, walks 2 miles a day. No other complaints  Assessment and plan Femoral neck fracture, right, closed, initial encounter. Orthopedics called by EDP. Status post FEMORAL NECK PINNING-by Dr. Percell Miller Provide pain control, ambulate per orthopedic recommendations, Patient reports pain as mild. Up with PT this morning. Ambulating well with the assistance of a walker. Appropriate for discharge later today from ortho standpoint.   Orthopedics started ASA 325 mg for   DVT prophylaxis postop, at the time of discharge  Hypertension-rx with  prn hydralazine  Hypothyroidism. -continue home Synthroid.  Rheumatoid arthritis, on chronic Methotrexate. Next dose due Thursday.   Sjogrens disease. -Continue home eye drops / vitamins    Discharge Exam:  Blood pressure 137/95, pulse 88, temperature 99.3 F (37.4 C), temperature source Oral, resp. rate 17, height 5' 6"  (1.676 m), weight 72.576 kg (160 lb), SpO2 95 %.   General: No acute respiratory distress Lungs: Clear to auscultation bilaterally without wheezes or crackles Cardiovascular: Regular rate and rhythm without murmur gallop or rub normal S1 and S2 Abdomen: Nontender,  nondistended, soft, bowel sounds positive, no rebound, no ascites, no appreciable mass Extremities: No significant cyanosis, clubbing, or edema bilateral lower extremities   Follow-up Information    Follow up with MURPHY, TIMOTHY D, MD In 10 days.   Specialty:  Orthopedic Surgery   Contact information:   [address] [place] [address] [phone]       Follow up with Horatio Pel, MD In 3 days.  Specialty:  Internal Medicine   Contact information:   [address] [place] [address] [phone]       Signed: Reyne Dumas 03/18/2015, 8:10 AM        Time spent >45 mins

## 2015-03-18 NOTE — Care Management Note (Signed)
Case Management Note  Patient Details  Name: Sheryl Foster MRN: [id number] Date of Birth: 1945/12/19  Subjective/Objective:    69 yr old female s/p right femur fracture, underwent right femoral neck pinning.               Action/Plan:  Case manager spoke with patient at the bedside concerning discharge needs. Patient has no home health needs. DME has been ordered.   Expected Discharge Date:    03/18/15              Expected Discharge Plan:   Home /self Care  In-House Referral:     Discharge planning Services  CM Consult  Post Acute Care Choice:  Durable Medical Equipment Choice offered to:  Patient  DME Arranged:  3-N-1, Walker rolling DME Agency:  [place]:  NA [place] Agency:     Status of Service:  Completed, signed off  Medicare Important Message Given:    Date Medicare IM Given:    Medicare IM give by:    Date Additional Medicare IM Given:    Additional Medicare Important Message give by:     If discussed at [place] of Stay Meetings, dates discussed:    Additional Comments:  Ninfa Meeker, RN 03/18/2015, 10:17 AM

## 2015-03-18 NOTE — Evaluation (Signed)
Occupational Therapy Evaluation Patient Details Name: Sheryl Foster MRN: [id number] DOB: 1945-12-04 Today's Date: 03/18/2015    History of Present Illness Pt admitted after fall with R femur fx s/p pinning. PMHx: sjogren's syndrome, RA, hypothyroidism   Clinical Impression   Pt reports she was independent with ADLs PTA. Currently pt is overall supervision for safety with ADLs and functional mobility. All education complete; pt with no further questions or concerns for OT at this time. Pt planning to d/c home with intermittent supervision from family/friends. Pt ready to d/c from an OT standpoint; signing off at this time. Thank you for this referral.     Follow Up Recommendations  No OT follow up;Supervision - Intermittent    Equipment Recommendations  3 in 1 bedside comode    Recommendations for Other Services       Precautions / Restrictions Precautions Precautions: Fall Restrictions Weight Bearing Restrictions: Yes RLE Weight Bearing: Weight bearing as tolerated      Mobility Bed Mobility Overal bed mobility: Modified Independent             General bed mobility comments: EOB on arrival  Transfers Overall transfer level: Needs assistance Equipment used: Rolling walker (2 wheeled) Transfers: Sit to/from Stand Sit to Stand: Supervision         General transfer comment: for safety    Balance Overall balance assessment: No apparent balance deficits (not formally assessed)                                          ADL Overall ADL's : Needs assistance/impaired Eating/Feeding: Set up;Sitting   Grooming: Supervision/safety;Standing   Upper Body Bathing: Supervision/ safety;Sitting;Standing   Lower Body Bathing: Supervison/ safety;Sit to/from stand   Upper Body Dressing : Supervision/safety;Standing Upper Body Dressing Details (indicate cue type and reason): Pt able to doff hospital gown and donn bra and shirt with supervision  for safety in standing Lower Body Dressing: Supervision/safety;Sit to/from stand Lower Body Dressing Details (indicate cue type and reason): Pt able to don underware and pants with supervision for safety in standing. Educated on compensatory strategies for LB ADLs; pt able to return Insurance claims handler: Supervision/safety;Ambulation;BSC;RW (BSC over toilet )   Toileting- Clothing Manipulation and Hygiene: Supervision/safety;Sit to/from stand   Tub/ Shower Transfer: Supervision/safety;Tub transfer;Ambulation;3 in 1;Rolling walker Tub/Shower Transfer Details (indicate cue type and reason): Educated on use of 3 in 1 in tub; pt verbalized understanding. Pt declined to practice at this time; pt able to verbalize how 3 in 1 fits in tub and transfer technique.  Functional mobility during ADLs: Supervision/safety;Rolling walker General ADL Comments: Pts son present; stepped out of room for OT eval. Educated on home safety, ice for pain and edema; pt verbalized understanding.     Vision     Perception     Praxis      Pertinent Vitals/Pain Pain Assessment: 0-10 Pain Score: 6  Pain Location: R hip Pain Descriptors / Indicators: Aching Pain Intervention(s): Limited activity within patient's tolerance;Monitored during session;Premedicated before session;Ice applied     Hand Dominance Right   Extremity/Trunk Assessment Upper Extremity Assessment Upper Extremity Assessment: Overall WFL for tasks assessed   Lower Extremity Assessment Lower Extremity Assessment: Defer to PT evaluation RLE Deficits / Details: limited by pain   Cervical / Trunk Assessment Cervical / Trunk Assessment: Normal   Communication Communication Communication: No difficulties   Cognition  Arousal/Alertness: Awake/alert Behavior During Therapy: WFL for tasks assessed/performed Overall Cognitive Status: Within Functional Limits for tasks assessed                     General Comments        Exercises       Shoulder Instructions      Home Living Family/patient expects to be discharged to:: Private residence Living Arrangements: Alone Available Help at Discharge: Friend(s);Family;Available 24 hours/day Type of Home: House Home Access: Stairs to enter [employer] of Steps: 1 Entrance Stairs-Rails: None Home Layout: Two level;Able to live on main level with bedroom/bathroom Alternate Level Stairs-Number of Steps: flight Alternate Level Stairs-Rails: Right Bathroom Shower/Tub: Tub/shower unit   Bathroom Toilet: Handicapped height Bathroom Accessibility: Yes How Accessible: Accessible via walker Home Equipment: [place] - single point;Crutches          Prior Functioning/Environment Level of Independence: Independent             OT Diagnosis: Acute pain   OT Problem List:     OT Treatment/Interventions:      OT Goals(Current goals can be found in the care plan section) Acute Rehab OT Goals Patient Stated Goal: to go home today OT Goal Formulation: With patient  OT Frequency:     Barriers to D/C:            Co-evaluation              End of Session Equipment Utilized During Treatment: Rolling walker  Activity Tolerance: Patient tolerated treatment well Patient left: in bed;with call bell/phone within reach   Time: [id number] OT Time Calculation (min): 13 min Charges:  OT General Charges $OT Visit: 1 Procedure OT Evaluation $Initial OT Evaluation Tier I: 1 Procedure G-Codes:     Binnie Kand M.S., OTR/L [id number]  03/18/2015, 9:07 AM

## 2015-03-18 NOTE — Evaluation (Signed)
Physical Therapy Evaluation/ Discharge Patient Details Name: Sheryl Foster MRN: [id number] DOB: Feb 05, 1946 Today's Date: 03/18/2015   History of Present Illness  Pt admitted after fall with R femur fx s/p pinning. 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 Pt eager to perform HEP and mobility. Cues provided for safety with recommendation for DME for home with no further education or intervention required at this time. Pt encouraged to ambulate acutely. Pt aware and agreeable to all above. Will sign off.     Follow Up Recommendations No PT follow up    Equipment Recommendations  Rolling walker with 5" wheels;3in1 (PT)    Recommendations for Other Services       Precautions / Restrictions Precautions Precautions: Fall Restrictions RLE Weight Bearing: Weight bearing as tolerated      Mobility  Bed Mobility               General bed mobility comments: EOB on arrival  Transfers Overall transfer level: Needs assistance   Transfers: Sit to/from Stand Sit to Stand: Supervision         General transfer comment: for safety  Ambulation/Gait Ambulation/Gait assistance: Supervision Ambulation Distance (Feet): 250 Feet Assistive device: Rolling walker (2 wheeled) Gait Pattern/deviations: Step-through pattern;Decreased stride length   Gait velocity interpretation: Below normal speed for age/gender General Gait Details: cues to look up and for normal gait pattern  Stairs Stairs: Yes Stairs assistance: Supervision Stair Management: Backwards;Step to pattern;Sideways;Forwards;With walker;One rail Right Number of Stairs: 5 General stair comments: 1 step backward with RW, 2 forward and sideways with rail on right. Cues for sequence  Wheelchair Mobility    Modified Rankin (Stroke Patients Only)       Balance                                              Pertinent Vitals/Pain Pain Assessment: 0-10 Pain Score: 3  Pain Location: right hip Pain Descriptors / Indicators: Aching Pain Intervention(s): Limited activity within patient's tolerance;Monitored during session;Repositioned    Home Living Family/patient expects to be discharged to:: Private residence Living Arrangements: Alone Available Help at Discharge: Friend(s);Family;Available 24 hours/day Type of Home: House Home Access: Stairs to enter Entrance Stairs-Rails: None Entrance Stairs-Number of Steps: 1 Home Layout: Two level;Able to live on main level with bedroom/bathroom Home Equipment: Kasandra Knudsen - single point;Crutches      Prior Function Level of Independence: Independent               Hand Dominance   Dominant Hand: Right    Extremity/Trunk Assessment   Upper Extremity Assessment: Overall WFL for tasks assessed           Lower Extremity Assessment: RLE deficits/detail RLE Deficits / Details: limited by pain    Cervical / Trunk Assessment: Normal  Communication   Communication: No difficulties  Cognition Arousal/Alertness: Awake/alert Behavior During Therapy: WFL for tasks assessed/performed Overall Cognitive Status: Within Functional Limits for tasks assessed                      General Comments      Exercises General Exercises - Lower Extremity Gluteal Sets: AROM;Seated;Both;15 reps Long Arc Quad: AROM;Seated;Right;15 reps Heel Slides: AROM;Seated;Right;10 reps Hip ABduction/ADduction: AROM;Seated;Both;15 reps Hip Flexion/Marching: AROM;Seated;Both;15 reps  Assessment/Plan    PT Assessment Patent does not need any further PT services  PT Diagnosis Difficulty walking;Acute pain   PT Problem List Decreased strength;Decreased activity tolerance;Pain  PT Treatment Interventions     PT Goals (Current goals can be found in the Care Plan section)      Frequency     Barriers to discharge         Co-evaluation               End of Session Equipment Utilized During Treatment: Gait belt Activity Tolerance: Patient tolerated treatment well Patient left: in chair;with call bell/phone within reach Nurse Communication: Mobility status         Time: [id number] PT Time Calculation (min) (ACUTE ONLY): 29 min   Charges:   PT Evaluation $Initial PT Evaluation Tier I: 1 Procedure PT Treatments $Gait Training: 8-22 mins   PT G CodesMelford Aase 03/18/2015, 7:51 AM Elwyn Reach, [place]

## 2015-03-18 NOTE — Progress Notes (Signed)
     Subjective:  POD#1 R hip pinning. Patient reports pain as mild.  Up with PT this morning.  Ambulating well with the assistance of a walker.  Appropriate for discharge later today from ortho standpoint.  Objective:   VITALS:   Filed Vitals:   03/17/15 1340 03/17/15 2215 03/18/15 0045 03/18/15 0609  BP: 153/79 125/68 130/82 137/95  Pulse: 83 83 88   Temp: 98.1 F (36.7 C) 100.4 F (38 C) 98.8 F (37.1 C) 99.3 F (37.4 C)  TempSrc: Oral Oral Oral Oral  Resp: 17 17 16 17   Height:      Weight:      SpO2: 97% 95% 96% 95%    Neurologically intact ABD soft Neurovascular intact Sensation intact distally Intact pulses distally Dorsiflexion/Plantar flexion intact Incision: dressing C/D/I   Lab Results  Component Value Date   WBC 7.5 03/18/2015   HGB 12.2 03/18/2015   HCT 36.5 03/18/2015   MCV 98.6 03/18/2015   PLT 166 03/18/2015   BMET    Component Value Date/Time   NA 143 03/16/2015 1127   K 3.9 03/16/2015 1127   CL 107 03/16/2015 1127   CO2 27 03/16/2015 1100   GLUCOSE 105* 03/16/2015 1127   BUN 16 03/16/2015 1127   CREATININE 0.80 03/16/2015 1127   CALCIUM 9.4 03/16/2015 1100   GFRNONAA >60 03/16/2015 1100   GFRAA >60 03/16/2015 1100     Assessment/Plan: 1 Day Post-Op   Active Problems:   Right femoral fracture (HCC)   Hypothyroid   Femoral neck fracture, right, closed, initial encounter   Femur fracture (HCC)   Rheumatoid arthritis (HCC)   Sjogren's disease ([place])   Up with therapy WBAT in the RLE ASA 325mg  daily for DVT prophylaxis Dressing changed today.  Ambulating well.  OK for discharge from ortho standpoint.   Sheryl Foster 03/18/2015, 7:31 AM Cell [phone]

## 2015-04-24 DIAGNOSIS — S72001D Fracture of unspecified part of neck of right femur, subsequent encounter for closed fracture with routine healing: Secondary | ICD-10-CM | POA: Diagnosis not present

## 2015-05-24 DIAGNOSIS — S72001D Fracture of unspecified part of neck of right femur, subsequent encounter for closed fracture with routine healing: Secondary | ICD-10-CM | POA: Diagnosis not present

## 2015-08-28 DIAGNOSIS — M25551 Pain in right hip: Secondary | ICD-10-CM | POA: Diagnosis not present

## 2015-09-05 DIAGNOSIS — M8580 Other specified disorders of bone density and structure, unspecified site: Secondary | ICD-10-CM | POA: Diagnosis not present

## 2015-09-05 DIAGNOSIS — M150 Primary generalized (osteo)arthritis: Secondary | ICD-10-CM | POA: Diagnosis not present

## 2015-09-05 DIAGNOSIS — M0589 Other rheumatoid arthritis with rheumatoid factor of multiple sites: Secondary | ICD-10-CM | POA: Diagnosis not present

## 2015-10-02 DIAGNOSIS — H52223 Regular astigmatism, bilateral: Secondary | ICD-10-CM | POA: Diagnosis not present

## 2015-10-02 DIAGNOSIS — H04123 Dry eye syndrome of bilateral lacrimal glands: Secondary | ICD-10-CM | POA: Diagnosis not present

## 2015-10-02 DIAGNOSIS — H5213 Myopia, bilateral: Secondary | ICD-10-CM | POA: Diagnosis not present

## 2015-10-02 DIAGNOSIS — H524 Presbyopia: Secondary | ICD-10-CM | POA: Diagnosis not present

## 2015-10-04 DIAGNOSIS — T84196A Other mechanical complication of internal fixation device of bone of right lower leg, initial encounter: Secondary | ICD-10-CM | POA: Diagnosis not present

## 2015-10-04 DIAGNOSIS — T8484XA Pain due to internal orthopedic prosthetic devices, implants and grafts, initial encounter: Secondary | ICD-10-CM | POA: Diagnosis not present

## 2015-10-04 DIAGNOSIS — T84498A Other mechanical complication of other internal orthopedic devices, implants and grafts, initial encounter: Secondary | ICD-10-CM | POA: Diagnosis not present

## 2015-10-16 DIAGNOSIS — T84498D Other mechanical complication of other internal orthopedic devices, implants and grafts, subsequent encounter: Secondary | ICD-10-CM | POA: Diagnosis not present

## 2015-11-11 DIAGNOSIS — M0579 Rheumatoid arthritis with rheumatoid factor of multiple sites without organ or systems involvement: Secondary | ICD-10-CM | POA: Diagnosis not present

## 2015-11-11 DIAGNOSIS — E559 Vitamin D deficiency, unspecified: Secondary | ICD-10-CM | POA: Diagnosis not present

## 2015-11-11 DIAGNOSIS — M8580 Other specified disorders of bone density and structure, unspecified site: Secondary | ICD-10-CM | POA: Diagnosis not present

## 2015-11-11 DIAGNOSIS — E039 Hypothyroidism, unspecified: Secondary | ICD-10-CM | POA: Diagnosis not present

## 2015-11-11 DIAGNOSIS — Z0000 Encounter for general adult medical examination without abnormal findings: Secondary | ICD-10-CM | POA: Diagnosis not present

## 2015-11-18 DIAGNOSIS — Z800 Family history of malignant neoplasm of digestive organs: Secondary | ICD-10-CM | POA: Diagnosis not present

## 2015-11-18 DIAGNOSIS — K5730 Diverticulosis of large intestine without perforation or abscess without bleeding: Secondary | ICD-10-CM | POA: Diagnosis not present

## 2015-11-18 DIAGNOSIS — Z85828 Personal history of other malignant neoplasm of skin: Secondary | ICD-10-CM | POA: Diagnosis not present

## 2015-11-18 DIAGNOSIS — Z0000 Encounter for general adult medical examination without abnormal findings: Secondary | ICD-10-CM | POA: Diagnosis not present

## 2015-11-18 DIAGNOSIS — M8580 Other specified disorders of bone density and structure, unspecified site: Secondary | ICD-10-CM | POA: Diagnosis not present

## 2015-11-18 DIAGNOSIS — Z1212 Encounter for screening for malignant neoplasm of rectum: Secondary | ICD-10-CM | POA: Diagnosis not present

## 2015-12-06 DIAGNOSIS — Z79899 Other long term (current) drug therapy: Secondary | ICD-10-CM | POA: Diagnosis not present

## 2015-12-06 DIAGNOSIS — M150 Primary generalized (osteo)arthritis: Secondary | ICD-10-CM | POA: Diagnosis not present

## 2015-12-06 DIAGNOSIS — M0589 Other rheumatoid arthritis with rheumatoid factor of multiple sites: Secondary | ICD-10-CM | POA: Diagnosis not present

## 2015-12-06 DIAGNOSIS — M8580 Other specified disorders of bone density and structure, unspecified site: Secondary | ICD-10-CM | POA: Diagnosis not present

## 2015-12-16 DIAGNOSIS — T84498D Other mechanical complication of other internal orthopedic devices, implants and grafts, subsequent encounter: Secondary | ICD-10-CM | POA: Diagnosis not present

## 2015-12-23 DIAGNOSIS — M25551 Pain in right hip: Secondary | ICD-10-CM | POA: Diagnosis not present

## 2015-12-23 NOTE — H&P (Signed)
TOTAL HIP ADMISSION H&P  Patient is admitted for right total hip arthroplasty.  Subjective:  Chief Complaint: right hip pain  HPI: Sheryl Foster, 70 y.o. female, has a history of pain and functional disability in the right hip(s) due to AVN and patient has failed non-surgical conservative treatments for greater than 12 weeks to include NSAID's and/or analgesics and activity modification.  Onset of symptoms was gradual starting 1 years ago with rapidlly worsening course since that time.The patient noted previous hip fracture repaired with cannulated screws.  developed bursitis and hardware removed with good results.  then worsening pai on the right hip(s).  Patient currently rates pain in the right hip at 6 out of 10 with activity. Patient has night pain, worsening of pain with activity and weight bearing, trendelenberg gait, pain that interfers with activities of daily living and pain with passive range of motion. Patient has evidence of AVN with flattening of the superior head and multiple cysts by imaging studies. This condition presents safety issues increasing the risk of falls. This patient has had proximal femur fracture.  There is no current active infection.  Patient Active Problem List   Diagnosis Date Noted  . Right femoral fracture ([place]) 03/16/2015  . Hypothyroid 03/16/2015  . Femoral neck fracture, right, closed, initial encounter 03/16/2015  . Femur fracture ([place]) 03/16/2015  . Rheumatoid arthritis ([place]) 03/16/2015  . Sjogren's disease ([place]) 03/16/2015  . Closed fracture of neck of right femur ([place])   . Pre-op chest exam    Past Medical History:  Diagnosis Date  . Cancer ([place])    skin cancer  basal cell  . Heart murmur    grade 1  not sympatic  . Hypothyroidism   . Pneumonia    history  . Rheumatoid arthritis ([place])   . Sjogren's disease [facility])     Past Surgical History:  Procedure Laterality Date  . DILATION AND CURETTAGE OF UTERUS    . HIP PINNING,CANNULATED  Right 03/17/2015   Procedure: FEMORAL NECK PINNING;  Surgeon: Renette Butters, MD;  Location: [facility];  Service: Orthopedics;  Laterality: Right;  . TONSILLECTOMY      No prescriptions prior to admission.   Allergies  Allergen Reactions  . Tobramycin Swelling    Swelling and redness in eye    Social History  Substance Use Topics  . Smoking status: Never Smoker  . Smokeless tobacco: Never Used  . Alcohol use 4.2 oz/week    7 Glasses of wine per week    Family History  Problem Relation Age of Onset  . Diabetes Mother   . Diabetes Brother      Review of Systems  Constitutional: Negative.   HENT: Negative.   Eyes: Negative.   Cardiovascular:       H/O heart murmur   Gastrointestinal: Negative.   Genitourinary: Negative.   Musculoskeletal: Positive for joint pain.  Skin: Negative.   Neurological: Negative.   Endo/Heme/Allergies:       H/O hypothyroidism  Psychiatric/Behavioral: Negative.     Objective:  Physical Exam  Constitutional: She appears well-developed and well-nourished.  HENT:  Head: Normocephalic and atraumatic.  Eyes: Conjunctivae and EOM are normal. Pupils are equal, round, and reactive to light.  Neck: Normal range of motion.  Cardiovascular: Normal rate and regular rhythm.   Murmur heard. Grade I-II SEM  Respiratory: Effort normal and breath sounds normal.  GI: Soft. Bowel sounds are normal.  Musculoskeletal:       Right hip: She exhibits  decreased range of motion.  Lymphadenopathy:    She has no cervical adenopathy.    Vital signs in last 24 hours: Temp:  [97.9 F (36.6 C)] 97.9 F (36.6 C) (09/29 1315) Pulse Rate:  [66] 66 (09/29 1315) Resp:  [20] 20 (09/29 1315) BP: (149)/(89) 149/89 (09/29 1315) SpO2:  [99 %] 99 % (09/29 1315) Weight:  [73 kg (161 lb)] 73 kg (161 lb) (09/29 1315)  Labs:   Estimated body mass index is 26.79 kg/m as calculated from the following:   Height as of 12/27/15: 5\' 5"  (1.651 m).   Weight as of 12/27/15: 73  kg (161 lb).   Imaging Review Plain radiographs demonstrateAVN of the right hip(s). The bone quality appears to be good for age and reported activity level.  Assessment/Plan:  AVN s/p hip fracture, right hip(s)  The patient history, physical examination, clinical judgement of the provider and imaging studies are consistent with end stage degenerative joint disease of the right hip(s) and total hip arthroplasty is deemed medically necessary. The treatment options including medical management, injection therapy, arthroscopy and arthroplasty were discussed at length. The risks and benefits of total hip arthroplasty were presented and reviewed. The risks due to aseptic loosening, infection, stiffness, dislocation/subluxation,  thromboembolic complications and other imponderables were discussed.  The patient acknowledged the explanation, agreed to proceed with the plan and consent was signed. Patient is being admitted for inpatient treatment for surgery, pain control, PT, OT, prophylactic antibiotics, VTE prophylaxis, progressive ambulation and ADL's and discharge planning.The patient is planning to be discharged home with home health services

## 2015-12-24 DIAGNOSIS — M87051 Idiopathic aseptic necrosis of right femur: Secondary | ICD-10-CM | POA: Diagnosis not present

## 2015-12-24 DIAGNOSIS — M25551 Pain in right hip: Secondary | ICD-10-CM | POA: Diagnosis not present

## 2015-12-24 DIAGNOSIS — Z23 Encounter for immunization: Secondary | ICD-10-CM | POA: Diagnosis not present

## 2015-12-27 ENCOUNTER — Encounter (HOSPITAL_COMMUNITY)
Admission: RE | Admit: 2015-12-27 | Discharge: 2015-12-27 | Disposition: A | Discharge status: Home / Self Care | Payer: PPO | Source: Ambulatory Visit | Attending: Orthopedic Surgery | Admitting: Orthopedic Surgery

## 2015-12-27 ENCOUNTER — Encounter (HOSPITAL_COMMUNITY): Payer: Self-pay

## 2015-12-27 ENCOUNTER — Ambulatory Visit (HOSPITAL_COMMUNITY)
Admission: RE | Admit: 2015-12-27 | Discharge: 2015-12-27 | Disposition: A | Discharge status: Home / Self Care | Payer: PPO | Source: Ambulatory Visit | Attending: Orthopedic Surgery | Admitting: Orthopedic Surgery

## 2015-12-27 ENCOUNTER — Other Ambulatory Visit (HOSPITAL_COMMUNITY): Payer: Self-pay | Admitting: *Deleted

## 2015-12-27 DIAGNOSIS — M1611 Unilateral primary osteoarthritis, right hip: Secondary | ICD-10-CM | POA: Diagnosis not present

## 2015-12-27 DIAGNOSIS — Z01818 Encounter for other preprocedural examination: Secondary | ICD-10-CM | POA: Insufficient documentation

## 2015-12-27 DIAGNOSIS — M47814 Spondylosis without myelopathy or radiculopathy, thoracic region: Secondary | ICD-10-CM | POA: Diagnosis not present

## 2015-12-27 HISTORY — DX: Pneumonia, unspecified organism: J18.9

## 2015-12-27 HISTORY — DX: Malignant (primary) neoplasm, unspecified: C80.1

## 2015-12-27 HISTORY — DX: Cardiac murmur, unspecified: R01.1

## 2015-12-27 LAB — CBC WITH DIFFERENTIAL/PLATELET
BASOS ABS: 0 10*3/uL (ref 0.0–0.1)
Basophils Relative: 0 %
EOS ABS: 0.1 10*3/uL (ref 0.0–0.7)
EOS PCT: 1 %
HCT: 42.5 % (ref 36.0–46.0)
Hemoglobin: 13.6 g/dL (ref 12.0–15.0)
Lymphocytes Relative: 24 %
Lymphs Abs: 2.1 10*3/uL (ref 0.7–4.0)
MCH: 31.1 pg (ref 26.0–34.0)
MCHC: 32 g/dL (ref 30.0–36.0)
MCV: 97.3 fL (ref 78.0–100.0)
Monocytes Absolute: 0.5 10*3/uL (ref 0.1–1.0)
Monocytes Relative: 6 %
Neutro Abs: 5.9 10*3/uL (ref 1.7–7.7)
Neutrophils Relative %: 69 %
PLATELETS: 242 10*3/uL (ref 150–400)
RBC: 4.37 MIL/uL (ref 3.87–5.11)
RDW: 14.5 % (ref 11.5–15.5)
WBC: 8.7 10*3/uL (ref 4.0–10.5)

## 2015-12-27 LAB — URINALYSIS, ROUTINE W REFLEX MICROSCOPIC
Bilirubin Urine: NEGATIVE
GLUCOSE, UA: NEGATIVE mg/dL
HGB URINE DIPSTICK: NEGATIVE
KETONES UR: NEGATIVE mg/dL
LEUKOCYTES UA: NEGATIVE
Nitrite: NEGATIVE
PH: 6 (ref 5.0–8.0)
Protein, ur: NEGATIVE mg/dL
Specific Gravity, Urine: 1.022 (ref 1.005–1.030)

## 2015-12-27 LAB — PROTIME-INR
INR: 1.14
Prothrombin Time: 14.6 seconds (ref 11.4–15.2)

## 2015-12-27 LAB — SURGICAL PCR SCREEN
MRSA, PCR: NEGATIVE
STAPHYLOCOCCUS AUREUS: NEGATIVE

## 2015-12-27 LAB — COMPREHENSIVE METABOLIC PANEL
ALT: 17 U/L (ref 14–54)
AST: 21 U/L (ref 15–41)
Albumin: 3.8 g/dL (ref 3.5–5.0)
Alkaline Phosphatase: 90 U/L (ref 38–126)
Anion gap: 6 (ref 5–15)
BUN: 14 mg/dL (ref 6–20)
CHLORIDE: 105 mmol/L (ref 101–111)
CO2: 28 mmol/L (ref 22–32)
CREATININE: 1.01 mg/dL — AB (ref 0.44–1.00)
Calcium: 10.1 mg/dL (ref 8.9–10.3)
GFR calc non Af Amer: 55 mL/min — ABNORMAL LOW (ref >60)
Glucose, Bld: 112 mg/dL — ABNORMAL HIGH (ref 65–99)
POTASSIUM: 3.8 mmol/L (ref 3.5–5.1)
SODIUM: 139 mmol/L (ref 135–145)
Total Bilirubin: 0.3 mg/dL (ref 0.3–1.2)
Total Protein: 6.6 g/dL (ref 6.5–8.1)

## 2015-12-27 LAB — TYPE AND SCREEN
ABO/RH(D): O POS
ANTIBODY SCREEN: NEGATIVE

## 2015-12-27 LAB — ABO/RH: ABO/RH(D): O POS

## 2015-12-27 LAB — APTT: APTT: 30 s (ref 24–36)

## 2015-12-27 NOTE — Progress Notes (Signed)
Requested EKG from Dr. Pennie Banter office.

## 2015-12-27 NOTE — Pre-Procedure Instructions (Signed)
    Sheryl Foster  12/27/2015      [id number] - [place], [place] - [address] [place] [address] Phone: [phone] Fax: [phone]    Your procedure is scheduled on 01-07-2016    Tuesday .  Report to [facility] Admitting at 12:30 PM    Call this number if you have problems the morning of surgery:  [phone]   Remember:  Do not eat food or drink liquids after midnight.   Take these medicines the morning of surgery with A SIP OF WATER Tylenol if needed,levothyroxine(Synthroid)             STOP ASPIRIN,ANTIINFLAMATORIES (IBUPROFEN,ALEVE,MOTRIN,ADVIL,GOODY'S POWDERS),HERBAL SUPPLEMENTS,FISH OIL,AND VITAMINS 5-7 DAYS PRIOR TO SURGERY   Do not wear jewelry, make-up or nail polish.  Do not wear lotions, powders, or perfumes, or deoderant.  Do not shave 48 hours prior to surgery.  Men may shave face and neck.   Do not bring valuables to the hospital.  [facility] is not responsible for any belongings or valuables.  Contacts, dentures or bridgework may not be worn into surgery.  Leave your suitcase in the car.  After surgery it may be brought to your room.  For patients admitted to the hospital, discharge time will be determined by your treatment team.  Patients discharged the day of surgery will not be allowed to drive home          Special instructions:  See attached Sheet for instructions on CHG showers  Please read over the following fact sheets that you were given. MRSA Information,incentive spirometry

## 2015-12-28 LAB — URINE CULTURE: Special Requests: NORMAL

## 2016-01-06 MED ORDER — TRANEXAMIC ACID 1000 MG/10ML IV SOLN
1000.0000 mg | INTRAVENOUS | Status: DC
  Filled 2016-01-06: qty 10

## 2016-01-07 ENCOUNTER — Inpatient Hospital Stay (HOSPITAL_COMMUNITY): Payer: PPO

## 2016-01-07 ENCOUNTER — Inpatient Hospital Stay (HOSPITAL_COMMUNITY)
Admission: RE | Admit: 2016-01-07 | Discharge: 2016-01-08 | DRG: 470 | Disposition: A | Discharge status: Home Health | Payer: PPO | Source: Ambulatory Visit | Attending: Orthopedic Surgery | Admitting: Orthopedic Surgery

## 2016-01-07 ENCOUNTER — Inpatient Hospital Stay (HOSPITAL_COMMUNITY): Payer: PPO | Admitting: Certified Registered Nurse Anesthetist

## 2016-01-07 ENCOUNTER — Encounter (HOSPITAL_COMMUNITY)
Admission: RE | Disposition: A | Discharge status: Home Health | Payer: Self-pay | Source: Ambulatory Visit | Attending: Orthopedic Surgery

## 2016-01-07 ENCOUNTER — Encounter (HOSPITAL_COMMUNITY): Payer: Self-pay | Admitting: *Deleted

## 2016-01-07 DIAGNOSIS — E039 Hypothyroidism, unspecified: Secondary | ICD-10-CM | POA: Diagnosis present

## 2016-01-07 DIAGNOSIS — Z96641 Presence of right artificial hip joint: Secondary | ICD-10-CM | POA: Diagnosis not present

## 2016-01-07 DIAGNOSIS — M069 Rheumatoid arthritis, unspecified: Secondary | ICD-10-CM | POA: Diagnosis present

## 2016-01-07 DIAGNOSIS — Z85828 Personal history of other malignant neoplasm of skin: Secondary | ICD-10-CM | POA: Diagnosis not present

## 2016-01-07 DIAGNOSIS — Z419 Encounter for procedure for purposes other than remedying health state, unspecified: Secondary | ICD-10-CM

## 2016-01-07 DIAGNOSIS — M8788 Other osteonecrosis, other site: Secondary | ICD-10-CM | POA: Diagnosis present

## 2016-01-07 DIAGNOSIS — M3500 Sicca syndrome, unspecified: Secondary | ICD-10-CM | POA: Diagnosis present

## 2016-01-07 DIAGNOSIS — M25551 Pain in right hip: Secondary | ICD-10-CM | POA: Diagnosis not present

## 2016-01-07 DIAGNOSIS — M169 Osteoarthritis of hip, unspecified: Secondary | ICD-10-CM | POA: Diagnosis not present

## 2016-01-07 DIAGNOSIS — M1611 Unilateral primary osteoarthritis, right hip: Principal | ICD-10-CM | POA: Diagnosis present

## 2016-01-07 DIAGNOSIS — Z471 Aftercare following joint replacement surgery: Secondary | ICD-10-CM | POA: Diagnosis not present

## 2016-01-07 HISTORY — PX: TOTAL HIP ARTHROPLASTY: SHX124

## 2016-01-07 SURGERY — ARTHROPLASTY, HIP, TOTAL, ANTERIOR APPROACH
Anesthesia: General | Site: Hip | Laterality: Right

## 2016-01-07 MED ORDER — METOCLOPRAMIDE HCL 5 MG PO TABS: 5.0000 mg | ORAL_TABLET | Freq: Three times a day (TID) | ORAL | Status: DC | PRN

## 2016-01-07 MED ORDER — LIDOCAINE 2% (20 MG/ML) 5 ML SYRINGE
INTRAMUSCULAR | Status: AC
  Filled 2016-01-07: qty 5

## 2016-01-07 MED ORDER — FENTANYL CITRATE (PF) 100 MCG/2ML IJ SOLN
INTRAMUSCULAR | Status: AC
  Filled 2016-01-07: qty 2

## 2016-01-07 MED ORDER — METHOCARBAMOL 1000 MG/10ML IJ SOLN
500.0000 mg | Freq: Four times a day (QID) | INTRAVENOUS | Status: DC | PRN
  Filled 2016-01-07: qty 5

## 2016-01-07 MED ORDER — LIDOCAINE HCL (CARDIAC) 20 MG/ML IV SOLN
INTRAVENOUS | Status: DC | PRN
  Administered 2016-01-07: 50 mg via INTRAVENOUS

## 2016-01-07 MED ORDER — CHLORHEXIDINE GLUCONATE 4 % EX LIQD: 60.0000 mL | Freq: Once | CUTANEOUS | Status: DC

## 2016-01-07 MED ORDER — BUPIVACAINE HCL (PF) 0.25 % IJ SOLN
INTRAMUSCULAR | Status: AC
  Filled 2016-01-07: qty 30

## 2016-01-07 MED ORDER — BUPIVACAINE-EPINEPHRINE 0.25% -1:200000 IJ SOLN
INTRAMUSCULAR | Status: DC | PRN
  Administered 2016-01-07: 30 mL

## 2016-01-07 MED ORDER — PHENYLEPHRINE 40 MCG/ML (10ML) SYRINGE FOR IV PUSH (FOR BLOOD PRESSURE SUPPORT)
PREFILLED_SYRINGE | INTRAVENOUS | Status: AC
  Filled 2016-01-07: qty 10

## 2016-01-07 MED ORDER — DEXTROSE-NACL 5-0.45 % IV SOLN
INTRAVENOUS | Status: DC
  Administered 2016-01-07: 22:00:00 via INTRAVENOUS

## 2016-01-07 MED ORDER — SODIUM CHLORIDE 0.9 % IV SOLN: 75.0000 mL/h | INTRAVENOUS | Status: DC

## 2016-01-07 MED ORDER — PROPOFOL 10 MG/ML IV BOLUS
INTRAVENOUS | Status: DC | PRN
  Administered 2016-01-07: 150 mg via INTRAVENOUS

## 2016-01-07 MED ORDER — ASPIRIN EC 325 MG PO TBEC: 325.0000 mg | DELAYED_RELEASE_TABLET | Freq: Every day | ORAL | 0 refills | Status: DC

## 2016-01-07 MED ORDER — PHENOL 1.4 % MT LIQD
1.0000 | OROMUCOSAL | Status: DC | PRN
Start: 2016-01-07 — End: 2016-01-08

## 2016-01-07 MED ORDER — DOCUSATE SODIUM 100 MG PO CAPS
100.0000 mg | ORAL_CAPSULE | Freq: Two times a day (BID) | ORAL | Status: DC
  Administered 2016-01-07 – 2016-01-08 (×2): 100 mg via ORAL
  Filled 2016-01-07 (×2): qty 1

## 2016-01-07 MED ORDER — KETOROLAC TROMETHAMINE 30 MG/ML IJ SOLN
INTRAMUSCULAR | Status: AC
  Filled 2016-01-07: qty 1

## 2016-01-07 MED ORDER — ONDANSETRON HCL 4 MG PO TABS: 4.0000 mg | ORAL_TABLET | Freq: Three times a day (TID) | ORAL | 0 refills | Status: DC | PRN

## 2016-01-07 MED ORDER — SODIUM CHLORIDE FLUSH 0.9 % IV SOLN
INTRAVENOUS | Status: DC | PRN
  Administered 2016-01-07: 30 mL via INTRAVENOUS

## 2016-01-07 MED ORDER — TRANEXAMIC ACID 1000 MG/10ML IV SOLN
INTRAVENOUS | Status: DC | PRN
  Administered 2016-01-07: 1000 mg via INTRAVENOUS

## 2016-01-07 MED ORDER — METOCLOPRAMIDE HCL 5 MG/ML IJ SOLN: 5.0000 mg | Freq: Three times a day (TID) | INTRAMUSCULAR | Status: DC | PRN

## 2016-01-07 MED ORDER — OXYCODONE HCL 5 MG PO TABS
5.0000 mg | ORAL_TABLET | ORAL | Status: DC | PRN
  Administered 2016-01-07 – 2016-01-08 (×2): 10 mg via ORAL
  Filled 2016-01-07 (×2): qty 2

## 2016-01-07 MED ORDER — SUGAMMADEX SODIUM 200 MG/2ML IV SOLN
INTRAVENOUS | Status: DC | PRN
  Administered 2016-01-07: 146 mg via INTRAVENOUS

## 2016-01-07 MED ORDER — ACETAMINOPHEN 650 MG RE SUPP: 650.0000 mg | Freq: Four times a day (QID) | RECTAL | Status: DC | PRN

## 2016-01-07 MED ORDER — SENNA 8.6 MG PO TABS
1.0000 | ORAL_TABLET | Freq: Two times a day (BID) | ORAL | Status: DC
  Administered 2016-01-07 – 2016-01-08 (×2): 8.6 mg via ORAL
  Filled 2016-01-07 (×2): qty 1

## 2016-01-07 MED ORDER — VITAMIN B 12 100 MCG PO LOZG: 1.0000 | LOZENGE | Freq: Every day | ORAL | Status: DC

## 2016-01-07 MED ORDER — FENTANYL CITRATE (PF) 100 MCG/2ML IJ SOLN
25.0000 ug | INTRAMUSCULAR | Status: DC | PRN
  Administered 2016-01-07 (×3): 50 ug via INTRAVENOUS

## 2016-01-07 MED ORDER — OMEPRAZOLE 20 MG PO CPDR: 20.0000 mg | DELAYED_RELEASE_CAPSULE | Freq: Every day | ORAL | 2 refills | Status: DC

## 2016-01-07 MED ORDER — DEXAMETHASONE SODIUM PHOSPHATE 10 MG/ML IJ SOLN
10.0000 mg | Freq: Once | INTRAMUSCULAR | Status: AC
  Administered 2016-01-08: 10 mg via INTRAVENOUS
  Filled 2016-01-07: qty 1

## 2016-01-07 MED ORDER — ROCURONIUM BROMIDE 10 MG/ML (PF) SYRINGE
PREFILLED_SYRINGE | INTRAVENOUS | Status: AC
  Filled 2016-01-07: qty 10

## 2016-01-07 MED ORDER — MENTHOL 3 MG MT LOZG: 1.0000 | LOZENGE | OROMUCOSAL | Status: DC | PRN

## 2016-01-07 MED ORDER — LABETALOL HCL 5 MG/ML IV SOLN
INTRAVENOUS | Status: AC
  Filled 2016-01-07: qty 4

## 2016-01-07 MED ORDER — LEVOTHYROXINE SODIUM 88 MCG PO TABS
88.0000 ug | ORAL_TABLET | Freq: Every day | ORAL | Status: DC
  Administered 2016-01-08: 88 ug via ORAL
  Filled 2016-01-07: qty 1

## 2016-01-07 MED ORDER — LABETALOL HCL 5 MG/ML IV SOLN
INTRAVENOUS | Status: DC | PRN
  Administered 2016-01-07: 10 mg via INTRAVENOUS

## 2016-01-07 MED ORDER — SUGAMMADEX SODIUM 200 MG/2ML IV SOLN
INTRAVENOUS | Status: AC
  Filled 2016-01-07: qty 2

## 2016-01-07 MED ORDER — ONDANSETRON HCL 4 MG/2ML IJ SOLN
INTRAMUSCULAR | Status: AC
  Filled 2016-01-07: qty 2

## 2016-01-07 MED ORDER — ONDANSETRON HCL 4 MG/2ML IJ SOLN: 4.0000 mg | Freq: Four times a day (QID) | INTRAMUSCULAR | Status: DC | PRN

## 2016-01-07 MED ORDER — ONDANSETRON HCL 4 MG/2ML IJ SOLN: 4.0000 mg | Freq: Once | INTRAMUSCULAR | Status: DC | PRN

## 2016-01-07 MED ORDER — LACTATED RINGERS IV SOLN
INTRAVENOUS | Status: DC
  Administered 2016-01-07 (×2): via INTRAVENOUS

## 2016-01-07 MED ORDER — ASPIRIN EC 325 MG PO TBEC
325.0000 mg | DELAYED_RELEASE_TABLET | Freq: Every day | ORAL | Status: DC
  Administered 2016-01-08: 325 mg via ORAL
  Filled 2016-01-07: qty 1

## 2016-01-07 MED ORDER — 0.9 % SODIUM CHLORIDE (POUR BTL) OPTIME
TOPICAL | Status: DC | PRN
  Administered 2016-01-07: 1000 mL

## 2016-01-07 MED ORDER — ACETAMINOPHEN 325 MG PO TABS
650.0000 mg | ORAL_TABLET | Freq: Four times a day (QID) | ORAL | Status: DC
  Administered 2016-01-08 (×2): 650 mg via ORAL
  Filled 2016-01-07 (×2): qty 2

## 2016-01-07 MED ORDER — OXYCODONE-ACETAMINOPHEN 5-325 MG PO TABS: 1.0000 | ORAL_TABLET | ORAL | 0 refills | Status: DC | PRN

## 2016-01-07 MED ORDER — DIPHENHYDRAMINE HCL 12.5 MG/5ML PO ELIX: 12.5000 mg | ORAL_SOLUTION | ORAL | Status: DC | PRN

## 2016-01-07 MED ORDER — PROPOFOL 10 MG/ML IV BOLUS
INTRAVENOUS | Status: AC
  Filled 2016-01-07: qty 20

## 2016-01-07 MED ORDER — ONDANSETRON HCL 4 MG PO TABS: 4.0000 mg | ORAL_TABLET | Freq: Four times a day (QID) | ORAL | Status: DC | PRN

## 2016-01-07 MED ORDER — FENTANYL CITRATE (PF) 100 MCG/2ML IJ SOLN
INTRAMUSCULAR | Status: DC | PRN
  Administered 2016-01-07 (×2): 100 ug via INTRAVENOUS
  Administered 2016-01-07 (×2): 50 ug via INTRAVENOUS

## 2016-01-07 MED ORDER — FLEET ENEMA 7-19 GM/118ML RE ENEM: 1.0000 | ENEMA | Freq: Once | RECTAL | Status: DC | PRN

## 2016-01-07 MED ORDER — MORPHINE SULFATE (PF) 2 MG/ML IV SOLN
2.0000 mg | INTRAVENOUS | Status: DC | PRN
  Administered 2016-01-07: 2 mg via INTRAVENOUS
  Filled 2016-01-07: qty 1

## 2016-01-07 MED ORDER — CEFAZOLIN SODIUM-DEXTROSE 2-4 GM/100ML-% IV SOLN
INTRAVENOUS | Status: AC
  Filled 2016-01-07: qty 100

## 2016-01-07 MED ORDER — SAME 400 MG PO TABS: 400.0000 mg | ORAL_TABLET | Freq: Every day | ORAL | Status: DC

## 2016-01-07 MED ORDER — SORBITOL 70 % SOLN: 30.0000 mL | Freq: Every day | Status: DC | PRN

## 2016-01-07 MED ORDER — CEFAZOLIN SODIUM-DEXTROSE 2-4 GM/100ML-% IV SOLN
2.0000 g | INTRAVENOUS | Status: AC
  Administered 2016-01-07: 2 g via INTRAVENOUS

## 2016-01-07 MED ORDER — KETOROLAC TROMETHAMINE 30 MG/ML IJ SOLN
INTRAMUSCULAR | Status: DC | PRN
  Administered 2016-01-07: 30 mg via INTRA_ARTICULAR

## 2016-01-07 MED ORDER — KETOROLAC TROMETHAMINE 15 MG/ML IJ SOLN
7.5000 mg | Freq: Four times a day (QID) | INTRAMUSCULAR | Status: DC
  Administered 2016-01-08 (×2): 7.5 mg via INTRAVENOUS
  Filled 2016-01-07 (×2): qty 1

## 2016-01-07 MED ORDER — POLYETHYLENE GLYCOL 3350 17 G PO PACK: 17.0000 g | PACK | Freq: Every day | ORAL | Status: DC | PRN

## 2016-01-07 MED ORDER — ACETAMINOPHEN 325 MG PO TABS: 650.0000 mg | ORAL_TABLET | Freq: Four times a day (QID) | ORAL | Status: DC | PRN

## 2016-01-07 MED ORDER — DOCUSATE SODIUM 100 MG PO CAPS: 100.0000 mg | ORAL_CAPSULE | Freq: Two times a day (BID) | ORAL | 0 refills | Status: DC

## 2016-01-07 MED ORDER — METHOCARBAMOL 500 MG PO TABS: 500.0000 mg | ORAL_TABLET | Freq: Four times a day (QID) | ORAL | 0 refills | Status: DC | PRN

## 2016-01-07 MED ORDER — CEFAZOLIN IN D5W 1 GM/50ML IV SOLN
1.0000 g | Freq: Four times a day (QID) | INTRAVENOUS | Status: AC
  Administered 2016-01-07 – 2016-01-08 (×2): 1 g via INTRAVENOUS
  Filled 2016-01-07 (×2): qty 50

## 2016-01-07 MED ORDER — METHOCARBAMOL 500 MG PO TABS
500.0000 mg | ORAL_TABLET | Freq: Four times a day (QID) | ORAL | Status: DC | PRN
  Administered 2016-01-08: 500 mg via ORAL
  Filled 2016-01-07: qty 1

## 2016-01-07 MED ORDER — CELECOXIB 200 MG PO CAPS
200.0000 mg | ORAL_CAPSULE | Freq: Two times a day (BID) | ORAL | Status: DC
  Administered 2016-01-07 – 2016-01-08 (×2): 200 mg via ORAL
  Filled 2016-01-07 (×2): qty 1

## 2016-01-07 SURGICAL SUPPLY — 52 items
BAG DECANTER FOR FLEXI CONT (MISCELLANEOUS) IMPLANT
BENZOIN TINCTURE PRP APPL 2/3 (GAUZE/BANDAGES/DRESSINGS) ×3 IMPLANT
BLADE SAG 18X100X1.27 (BLADE) IMPLANT
BLADE SAW SGTL 18X1.27X75 (BLADE) IMPLANT
BLADE SAW SGTL 18X1.27X75MM (BLADE)
BLADE SURG ROTATE 9660 (MISCELLANEOUS) IMPLANT
CAPT HIP TOTAL 2 ×3 IMPLANT
CLOSURE STERI-STRIP 1/2X4 (GAUZE/BANDAGES/DRESSINGS) ×1
CLSR STERI-STRIP ANTIMIC 1/2X4 (GAUZE/BANDAGES/DRESSINGS) ×2 IMPLANT
COVER PERINEAL POST (MISCELLANEOUS) ×3 IMPLANT
COVER SURGICAL LIGHT HANDLE (MISCELLANEOUS) ×3 IMPLANT
DRAPE C-ARM 42X72 X-RAY (DRAPES) IMPLANT
DRAPE STERI IOBAN 125X83 (DRAPES) ×3 IMPLANT
DRAPE U-SHAPE 47X51 STRL (DRAPES) ×6 IMPLANT
DRSG MEPILEX BORDER 4X8 (GAUZE/BANDAGES/DRESSINGS) ×3 IMPLANT
DURAPREP 26ML APPLICATOR (WOUND CARE) ×3 IMPLANT
ELECT BLADE 4.0 EZ CLEAN MEGAD (MISCELLANEOUS) ×3
ELECT REM PT RETURN 9FT ADLT (ELECTROSURGICAL) ×3
ELECTRODE BLDE 4.0 EZ CLN MEGD (MISCELLANEOUS) ×1 IMPLANT
ELECTRODE REM PT RTRN 9FT ADLT (ELECTROSURGICAL) ×1 IMPLANT
FACESHIELD WRAPAROUND (MASK) ×6 IMPLANT
GLOVE BIO SURGEON STRL SZ7.5 (GLOVE) ×6 IMPLANT
GLOVE BIOGEL PI IND STRL 8 (GLOVE) ×2 IMPLANT
GLOVE BIOGEL PI INDICATOR 8 (GLOVE) ×4
GOWN STRL REUS W/ TWL LRG LVL3 (GOWN DISPOSABLE) ×2 IMPLANT
GOWN STRL REUS W/ TWL XL LVL3 (GOWN DISPOSABLE) ×1 IMPLANT
GOWN STRL REUS W/TWL LRG LVL3 (GOWN DISPOSABLE) ×4
GOWN STRL REUS W/TWL XL LVL3 (GOWN DISPOSABLE) ×2
KIT BASIN OR (CUSTOM PROCEDURE TRAY) ×3 IMPLANT
KIT ROOM TURNOVER OR (KITS) ×3 IMPLANT
MANIFOLD NEPTUNE II (INSTRUMENTS) ×3 IMPLANT
NDL SAFETY ECLIPSE 18X1.5 (NEEDLE) ×1 IMPLANT
NEEDLE 18GX1X1/2 (RX/OR ONLY) (NEEDLE) IMPLANT
NEEDLE HYPO 18GX1.5 SHARP (NEEDLE) ×2
NS IRRIG 1000ML POUR BTL (IV SOLUTION) ×3 IMPLANT
PACK TOTAL JOINT (CUSTOM PROCEDURE TRAY) ×3 IMPLANT
PACK UNIVERSAL I (CUSTOM PROCEDURE TRAY) ×3 IMPLANT
PAD ARMBOARD 7.5X6 YLW CONV (MISCELLANEOUS) ×3 IMPLANT
SPONGE LAP 18X18 X RAY DECT (DISPOSABLE) IMPLANT
SUT MNCRL AB 4-0 PS2 18 (SUTURE) ×3 IMPLANT
SUT MON AB 2-0 CT1 36 (SUTURE) ×3 IMPLANT
SUT VIC AB 0 CT1 27 (SUTURE) ×2
SUT VIC AB 0 CT1 27XBRD ANBCTR (SUTURE) ×1 IMPLANT
SUT VIC AB 1 CT1 27 (SUTURE) ×2
SUT VIC AB 1 CT1 27XBRD ANBCTR (SUTURE) ×1 IMPLANT
SYR 50ML LL SCALE MARK (SYRINGE) ×3 IMPLANT
SYRINGE 20CC LL (MISCELLANEOUS) IMPLANT
TOWEL OR 17X24 6PK STRL BLUE (TOWEL DISPOSABLE) ×3 IMPLANT
TOWEL OR 17X26 10 PK STRL BLUE (TOWEL DISPOSABLE) ×3 IMPLANT
TRAY FOLEY CATH 16FRSI W/METER (SET/KITS/TRAYS/PACK) IMPLANT
WATER STERILE IRR 1000ML POUR (IV SOLUTION) IMPLANT
YANKAUER SUCT BULB TIP NO VENT (SUCTIONS) ×3 IMPLANT

## 2016-01-07 NOTE — Op Note (Signed)
01/07/2016  6:43 PM  PATIENT:  Sheryl Foster   MRN: [id number]  PRE-OPERATIVE DIAGNOSIS:  DJD RIGHT HIP  POST-OPERATIVE DIAGNOSIS:  * No post-op diagnosis entered *  PROCEDURE:  Procedure(s): RIGHT TOTAL HIP ARTHROPLASTY ANTERIOR APPROACH  PREOPERATIVE INDICATIONS:    Sheryl Foster is an 70 y.o. female who has a diagnosis of <principal problem not specified> and elected for surgical management after failing conservative treatment.  The risks benefits and alternatives were discussed with the patient including but not limited to the risks of nonoperative treatment, versus surgical intervention including infection, bleeding, nerve injury, periprosthetic fracture, the need for revision surgery, dislocation, leg length discrepancy, blood clots, cardiopulmonary complications, morbidity, mortality, among others, and they were willing to proceed.     OPERATIVE REPORT     SURGEON:   Vanna Sailer, Ernesta Amble, MD    ASSISTANT:  Roxan Hockey, PA-C, he was present and scrubbed throughout the case, critical for completion in a timely fashion, and for retraction, instrumentation, and closure.     ANESTHESIA:  General    COMPLICATIONS:  None.     COMPONENTS:  Stryker acolade fit femur size 6 with a 36 mm -5 head ball and a PSL acetabular shell size 50 with a  polyethylene liner    PROCEDURE IN DETAIL:   The patient was met in the holding area and  identified.  The appropriate hip was identified and marked at the operative site.  The patient was then transported to the OR  and  placed under anesthesia per that record.  At that point, the patient was  placed in the supine position and  secured to the operating room table and all bony prominences padded. He received pre-operative antibiotics    The operative lower extremity was prepped from the iliac crest to the distal leg.  Sterile draping was performed.  Time out was performed prior to incision.      Skin incision was made just  2 cm lateral to the ASIS  extending in line with the tensor fascia lata. Electrocautery was used to control all bleeders. I dissected down sharply to the fascia of the tensor fascia lata was confirmed that the muscle fibers beneath were running posteriorly. I then incised the fascia over the superficial tensor fascia lata in line with the incision. The fascia was elevated off the anterior aspect of the muscle the muscle was retracted posteriorly and protected throughout the case. I then used electrocautery to incise the tensor fascia lata fascia control and all bleeders. Immediately visible was the fat over top of the anterior neck and capsule.  I removed the anterior fat from the capsule and elevated the rectus muscle off of the anterior capsule. I then removed a large time of capsule. The retractors were then placed over the anterior acetabulum as well as around the superior and inferior neck.  I then removed a section of the femoral neck and a napkin ring fashion. Then used the power course to remove the femoral head from the acetabulum and thoroughly irrigated the acetabulum. I sized the femoral head.    I then exposed the deep acetabulum, cleared out any tissue including the ligamentum teres.   After adequate visualization, I excised the labrum, and then sequentially reamed.  I then impacted the acetabular implant into place using fluoroscopy for guidance.  Appropriate version and inclination was confirmed clinically matching their bony anatomy, and with fluoroscopy.  I placed a 9m screw in the posterior/superio position with an excellent  bite.    I then placed the polyethylene liner in place  I then adducted the leg and released the external rotators from the posterior femur allowing it to be easily delivered up lateral and anterior to the acetabulum for preparation of the femoral canal.    I then prepared the proximal femur using the cookie-cutter and then sequentially reamed and  broached.  A trial broach, neck, and head was utilized, and I reduced the hip and used floroscopy to assess the neck length and femoral implant.  I then impacted the femoral prosthesis into place into the appropriate version. The hip was then reduced and fluoroscopy confirmed appropriate position. Leg lengths were restored.  I then irrigated the hip copiously again with, and repaired the fascia with Vicryl, followed by monocryl for the subcutaneous tissue, Monocryl for the skin, Steri-Strips and sterile gauze. The patient was then awakened and returned to PACU in stable and satisfactory condition. There were no complications.  POST OPERATIVE PLAN: WBAT, DVT px: SCD's/TED and ASA 325  Edmonia Lynch, MD Orthopedic Surgeon [phone]

## 2016-01-07 NOTE — Discharge Instructions (Signed)

## 2016-01-07 NOTE — Anesthesia Preprocedure Evaluation (Addendum)
Anesthesia Evaluation  Patient identified by MRN, date of birth, ID band Patient awake    Reviewed: Allergy & Precautions, NPO status , Patient's Chart, lab work & pertinent test results  History of Anesthesia Complications Negative for: history of anesthetic complications  Airway Mallampati: I  TM Distance: >3 FB Neck ROM: Full    Dental no notable dental hx. (+) Teeth Intact, Dental Advisory Given   Pulmonary neg pulmonary ROS,    Pulmonary exam normal breath sounds clear to auscultation       Cardiovascular Exercise Tolerance: Good  Rhythm:Regular Rate:Normal + Systolic murmurs    Neuro/Psych negative neurological ROS  negative psych ROS   GI/Hepatic negative GI ROS, Neg liver ROS,   Endo/Other  Hypothyroidism   Renal/GU negative Renal ROS  negative genitourinary   Musculoskeletal  (+) Arthritis , Rheumatoid disorders,  Sjogren's disease   Abdominal   Peds  Hematology negative hematology ROS (+)   Anesthesia Other Findings Day of surgery medications reviewed with the patient.  Reproductive/Obstetrics negative OB ROS                            Anesthesia Physical  Anesthesia Plan  ASA: II  Anesthesia Plan: General   Post-op Pain Management:    Induction: Intravenous  Airway Management Planned: Oral ETT  Additional Equipment:   Intra-op Plan:   Post-operative Plan: Extubation in OR  Informed Consent: I have reviewed the patients History and Physical, chart, labs and discussed the procedure including the risks, benefits and alternatives for the proposed anesthesia with the patient or authorized representative who has indicated his/her understanding and acceptance.   Dental advisory given  Plan Discussed with: CRNA  Anesthesia Plan Comments: (Risks/benefits of general anesthesia discussed with patient including risk of damage to teeth, lips, gum, and tongue,  nausea/vomiting, allergic reactions to medications, and the possibility of heart attack, stroke and death.  All patient questions answered.  Patient wishes to proceed.)       Anesthesia Quick Evaluation

## 2016-01-07 NOTE — Interval H&P Note (Signed)
History and Physical Interval Note:  01/07/2016 1:38 PM  Sheryl Foster Jenifer  has presented today for surgery, with the diagnosis of DJD RIGHT HIP  The various methods of treatment have been discussed with the patient and family. After consideration of risks, benefits and other options for treatment, the patient has consented to  Procedure(s): RIGHT TOTAL HIP ARTHROPLASTY ANTERIOR APPROACH (Right) as a surgical intervention .  The patient's history has been reviewed, patient examined, no change in status, stable for surgery.  I have reviewed the patient's chart and labs.  Questions were answered to the patient's satisfaction.     Makinsey Pepitone D

## 2016-01-07 NOTE — Transfer of Care (Signed)
Immediate Anesthesia Transfer of Care Note  Patient: Sheryl Foster  Procedure(s) Performed: Procedure(s): RIGHT TOTAL HIP ARTHROPLASTY ANTERIOR APPROACH (Right)  Patient Location: PACU  Anesthesia Type:General  Level of Consciousness: awake, alert  and oriented  Airway & Oxygen Therapy: Patient Spontanous Breathing and Patient connected to nasal cannula oxygen  Post-op Assessment: Report given to RN and Post -op Vital signs reviewed and stable  Post vital signs: Reviewed and stable  Last Vitals:  Vitals:   01/07/16 1217 01/07/16 1941  BP: (!) 158/87 125/69  Pulse: 76 77  Resp: 18 18  Temp: 37 C 36.1 C    Last Pain:  Vitals:   01/07/16 1233  TempSrc:   PainSc: 4       Patients Stated Pain Goal: 5 ([id number])  Complications: No apparent anesthesia complications

## 2016-01-07 NOTE — Anesthesia Procedure Notes (Signed)
Procedure Name: Intubation Date/Time: 01/07/2016 5:52 PM Performed by: Eligha Bridegroom Pre-anesthesia Checklist: Emergency Drugs available, Patient identified, Suction available, Patient being monitored and Timeout performed Patient Re-evaluated:Patient Re-evaluated prior to inductionOxygen Delivery Method: Circle system utilized Preoxygenation: Pre-oxygenation with 100% oxygen Intubation Type: IV induction Ventilation: Mask ventilation without difficulty Laryngoscope Size: Mac and 4 Grade View: Grade I Tube type: Oral Tube size: 7.0 mm Number of attempts: 1 Airway Equipment and Method: Stylet Placement Confirmation: ETT inserted through vocal cords under direct vision,  positive ETCO2 and breath sounds checked- equal and bilateral Tube secured with: Tape Dental Injury: Teeth and Oropharynx as per pre-operative assessment

## 2016-01-08 ENCOUNTER — Encounter (HOSPITAL_COMMUNITY): Payer: Self-pay | Admitting: Orthopedic Surgery

## 2016-01-08 LAB — CBC
HCT: 31.7 % — ABNORMAL LOW (ref 36.0–46.0)
HEMOGLOBIN: 10.4 g/dL — AB (ref 12.0–15.0)
MCH: 31.2 pg (ref 26.0–34.0)
MCHC: 32.8 g/dL (ref 30.0–36.0)
MCV: 95.2 fL (ref 78.0–100.0)
PLATELETS: 188 10*3/uL (ref 150–400)
RBC: 3.33 MIL/uL — ABNORMAL LOW (ref 3.87–5.11)
RDW: 14.2 % (ref 11.5–15.5)
WBC: 12.2 10*3/uL — ABNORMAL HIGH (ref 4.0–10.5)

## 2016-01-08 LAB — BASIC METABOLIC PANEL
Anion gap: 7 (ref 5–15)
BUN: 9 mg/dL (ref 6–20)
CALCIUM: 8.4 mg/dL — AB (ref 8.9–10.3)
CHLORIDE: 102 mmol/L (ref 101–111)
CO2: 27 mmol/L (ref 22–32)
CREATININE: 0.83 mg/dL (ref 0.44–1.00)
GFR calc Af Amer: >60 mL/min (ref >60)
GFR calc non Af Amer: >60 mL/min (ref >60)
Glucose, Bld: 191 mg/dL — ABNORMAL HIGH (ref 65–99)
Potassium: 4.4 mmol/L (ref 3.5–5.1)
SODIUM: 136 mmol/L (ref 135–145)

## 2016-01-08 NOTE — Progress Notes (Signed)
I spoke with Filiberto Pinks, PA-C regarding patient's left eye irritation.  Pt states her eye feels like it's scratched or something in it.  No apparent foreign body in her eye, and eye is red.  Pt states she uses systane eye drops and her son administered 1 of patient systane eye drops to her left eye. Order obtained from [place] to flush patient's eye with saline and allow patient to continue using systane drops as she uses them at home.  Patient informed of orders and declined the saline flush to her eye.  At 2315 patient's eye is no longer red and she states that it feels much better.

## 2016-01-08 NOTE — Evaluation (Signed)
Physical Therapy Evaluation Patient Details Name: ANNAKATE FASBENDER MRN: [id number] DOB: 1945-08-25 Today's Date: 01/08/2016   History of Present Illness  Pt is a 70 y/o female s/p R THA (anterior approach). PMH including but not limited to R femoral neck fx with pinning (2016).  Clinical Impression  Pt presented supine in bed with HOB elevated, awake and willing to participate in therapy session. Prior to admission, pt stated that she was using a SPC to ambulate secondary to increased pain. Pt moving very well during evaluation session and required no physical assistance with functional mobility. Pt would continue to benefit from skilled physical therapy services at this time while admitted and after d/c to address her below listed limitations in order to improve her overall safety and independence with functional mobility.     Follow Up Recommendations Home health PT;Supervision for mobility/OOB    Equipment Recommendations  None recommended by PT    Recommendations for Other Services       Precautions / Restrictions Precautions Precautions: Fall Restrictions Weight Bearing Restrictions: Yes RLE Weight Bearing: Weight bearing as tolerated      Mobility  Bed Mobility Overal bed mobility: Needs Assistance Bed Mobility: Supine to Sit     Supine to sit: Supervision;HOB elevated     General bed mobility comments: pt required increased time   Transfers Overall transfer level: Needs assistance Equipment used: Rolling walker (2 wheeled) Transfers: Sit to/from Stand Sit to Stand: Supervision         General transfer comment: pt required increased time, good technique and hand placement  Ambulation/Gait Ambulation/Gait assistance: Min guard Ambulation Distance (Feet): 100 Feet Assistive device: Rolling walker (2 wheeled) Gait Pattern/deviations: Step-through pattern;Decreased step length - left;Decreased stance time - right;Decreased weight shift to right Gait  velocity: decreased Gait velocity interpretation: Below normal speed for age/gender General Gait Details: pt demonstrated good, safe technique with RW  Stairs            Wheelchair Mobility    Modified Rankin (Stroke Patients Only)       Balance Overall balance assessment: Needs assistance Sitting-balance support: Feet supported;No upper extremity supported Sitting balance-Leahy Scale: Fair     Standing balance support: During functional activity;Bilateral upper extremity supported Standing balance-Leahy Scale: Poor Standing balance comment: pt reliant on bilateral UEs on RW                             Pertinent Vitals/Pain Pain Assessment: 0-10 Pain Score: 4  Pain Location: R hip Pain Descriptors / Indicators: Sore;Guarding Pain Intervention(s): Monitored during session;Repositioned;Ice applied    Home Living Family/patient expects to be discharged to:: Private residence Living Arrangements: Alone Available Help at Discharge: Family;Available 24 hours/day;Other (Comment) (pt's sons will be staying with her for a few days) Type of Home: House Home Access: Level entry     Home Layout: Two level;Able to live on main level with bedroom/bathroom Home Equipment: Gilford Rile - 2 wheels;Cane - single point;Bedside commode      Prior Function Level of Independence: Independent with assistive device(s)         Comments: pt stated that she has been ambulating with use of SPC secondary to pain. Pt is a retired PT.     Hand Dominance        Extremity/Trunk Assessment   Upper Extremity Assessment: Defer to OT evaluation           Lower Extremity Assessment: RLE deficits/detail RLE  Deficits / Details: pt with decreased strength and ROM limitations secondary to post-op.    Cervical / Trunk Assessment: Normal  Communication   Communication: No difficulties  Cognition Arousal/Alertness: Awake/alert Behavior During Therapy: WFL for tasks  assessed/performed Overall Cognitive Status: Within Functional Limits for tasks assessed                      General Comments      Exercises Total Joint Exercises Hip ABduction/ADduction: AAROM;Right;10 reps;Seated Long Arc Quad: AROM;Strengthening;Right;10 reps;Seated Marching in Standing: AROM;Strengthening;Both;10 reps;Seated   Assessment/Plan    PT Assessment Patient needs continued PT services  PT Problem List Decreased strength;Decreased range of motion;Decreased activity tolerance;Decreased balance;Decreased mobility;Decreased coordination;Pain          PT Treatment Interventions DME instruction;Gait training;Stair training;Functional mobility training;Therapeutic activities;Therapeutic exercise;Balance training;Neuromuscular re-education;Patient/family education    PT Goals (Current goals can be found in the Care Plan section)  Acute Rehab PT Goals Patient Stated Goal: to return home today PT Goal Formulation: With patient Time For Goal Achievement: 01/15/16 Potential to Achieve Goals: Good    Frequency 7X/week   Barriers to discharge        Co-evaluation               End of Session Equipment Utilized During Treatment: Gait belt Activity Tolerance: Patient tolerated treatment well Patient left: in chair;with call bell/phone within reach Nurse Communication: Mobility status         Time: [id number] PT Time Calculation (min) (ACUTE ONLY): 20 min   Charges:   PT Evaluation $PT Eval Moderate Complexity: 1 Procedure     PT G CodesClearnce Sorrel Samone Guhl 01/08/2016, [place], PT, DPT [phone]

## 2016-01-08 NOTE — Progress Notes (Signed)
Discharge instructions given. Pt verbalized understanding and all questions were answered.  

## 2016-01-08 NOTE — Care Management Note (Signed)
Case Management Note  Patient Details  Name: Sheryl Foster MRN: [id number] Date of Birth: September 02, 1945  Subjective/Objective:    70 yr old female s/p right total hip arthroplasty.                Action/Plan: Case manager spoke with patient concerning [employer] and DME needs. Choice was offered for [facility]. Patient states she wants to use [facility]. CM spoke with Santiago Glad, [facility] Liaison with referral. Patient states she has rolling walker, 3in1 and a cane. States she will need hospital bed and realizes she may have to pay rental cost. CM placed order for Hospital bed.    Expected Discharge Date:   01/08/16               Expected Discharge Plan:  [place]  In-House Referral:     Discharge planning Services  CM Consult  Post Acute Care Choice:  Home Health, Durable Medical Equipment Choice offered to:  Patient  DME Arranged:  N/A DME Agency:  NA  HH Arranged:  PT [place] Agency:  [place]  Status of Service:  Completed, signed off  If discussed at [place] of Stay Meetings, dates discussed:    Additional Comments:  Ninfa Meeker, RN 01/08/2016, 12:19 PM

## 2016-01-08 NOTE — Progress Notes (Signed)
OT Cancellation Note and Discharge  Patient Details Name: Sheryl Foster MRN: [id number] DOB: 11-29-1945   Cancelled Treatment:    Reason Eval/Treat Not Completed: OT screened, no needs identified, will sign off  Jaci Carrel 01/08/2016, 1:53 PM   Hulda Humphrey OTR/L [phone]

## 2016-01-08 NOTE — Discharge Summary (Signed)
Discharge Summary  Patient ID: Sheryl Foster MRN: [id number] DOB/AGE: [birth year] 70 y.o.  Admit date: 01/07/2016 Discharge date: 01/08/2016  Admission Diagnoses:  Primary osteoarthritis of right hip  Discharge Diagnoses:  Principal Problem:   Primary osteoarthritis of right hip Active Problems:   Hypothyroid   Rheumatoid arthritis ([place])   Sjogren's disease ([place])   Past Medical History:  Diagnosis Date  . Cancer ([place])    skin cancer  basal cell  . Heart murmur    grade 1  not sympatic  . Hypothyroidism   . Pneumonia    history  . Rheumatoid arthritis ([place])   . Sjogren's disease ([place])     Surgeries: Procedure(s): RIGHT TOTAL HIP ARTHROPLASTY ANTERIOR APPROACH on 01/07/2016   Consultants (if any):   Discharged Condition: Improved  Progress  Subjective: Feeling really well.  Slept great.  OOB.  Pain controlled with PO meds.  Tolerating liquids.  Urinating.  No CP, SOB.  Objective: General: NAD.  Supine in bed Resp: No increased WOB Cardio: regular rate and rhythm ABD soft Neurologically intact MSK Neurovascularly intact Sensation intact distally Intact pulses distally Dorsiflexion/Plantar flexion intact Incision: dressing C/D/I  Plan: Weight Bearing: Weight Bearing as Tolerated (WBAT) right leg Dressings: PRN.  VTE prophylaxis: Aspirin, ambulation, SCDs Dispo: Home today pending PT eval  Hospital Course: Sheryl Foster is an 70 y.o. female who was admitted 01/07/2016 with a diagnosis of Primary osteoarthritis of right hip and went to the operating room on 01/07/2016 and underwent the above named procedures.    She was given perioperative antibiotics:  Anti-infectives    Start     Dose/Rate Route Frequency Ordered Stop   01/08/16 0600  ceFAZolin (ANCEF) IVPB 2g/100 mL premix     2 g 200 mL/hr over 30 Minutes Intravenous On call to O.R. 01/07/16 1217 01/07/16 1715   01/07/16 2330  ceFAZolin (ANCEF) IVPB 1 g/50 mL premix     1 g 100  mL/hr over 30 Minutes Intravenous Every 6 hours 01/07/16 2103 01/08/16 0619   01/07/16 1224  ceFAZolin (ANCEF) 2-4 GM/100ML-% IVPB    Comments:  Ronnald Ramp, [place]   : cabinet override      01/07/16 1224 01/07/16 1715    .  She was given sequential compression devices, early ambulation, and ASA 325 mg for DVT prophylaxis.  She benefited maximally from the hospital stay and there were no complications.    Recent vital signs:  Vitals:   01/08/16 0022 01/08/16 0539  BP: (!) 125/58 (!) 121/56  Pulse: 64 62  Resp: 16 16  Temp: 98.5 F (36.9 C) 98.5 F (36.9 C)    Recent laboratory studies:  Lab Results  Component Value Date   HGB 10.4 (L) 01/08/2016   HGB 13.6 12/27/2015   HGB 12.2 03/18/2015   Lab Results  Component Value Date   WBC 12.2 (H) 01/08/2016   PLT 188 01/08/2016   Lab Results  Component Value Date   INR 1.14 12/27/2015   Lab Results  Component Value Date   NA 136 01/08/2016   K 4.4 01/08/2016   CL 102 01/08/2016   CO2 27 01/08/2016   BUN 9 01/08/2016   CREATININE 0.83 01/08/2016   GLUCOSE 191 (H) 01/08/2016    Discharge Medications:     Medication List    STOP taking these medications   aspirin 325 MG tablet Replaced by:  aspirin EC 325 MG tablet   HYDROcodone-acetaminophen 5-325 MG tablet Commonly known as:  NORCO  TAKE these medications   acetaminophen 500 MG tablet Commonly known as:  TYLENOL Take 500 mg by mouth every 6 (six) hours as needed for mild pain.   aspirin EC 325 MG tablet Take 1 tablet (325 mg total) by mouth daily. Replaces:  aspirin 325 MG tablet   CALCIUM-VITAMIN D PO Take 1 capsule by mouth daily.   docusate sodium 100 MG capsule Commonly known as:  COLACE Take 1 capsule (100 mg total) by mouth 2 (two) times daily. To prevent constipation while taking pain medication. What changed:  additional instructions   FISH OIL PO Take 1 capsule by mouth 2 (two) times daily.   folic acid 1 MG tablet Commonly known as:   FOLVITE Take 1 mg by mouth daily.   GLUCOSAMINE PO Take 1 capsule by mouth daily.   levothyroxine 88 MCG tablet Commonly known as:  SYNTHROID, LEVOTHROID Take 1 tablet by mouth daily.   meloxicam 15 MG tablet Commonly known as:  MOBIC Take 7.5 mg by mouth daily as needed.   methocarbamol 500 MG tablet Commonly known as:  ROBAXIN Take 1 tablet (500 mg total) by mouth every 6 (six) hours as needed for muscle spasms. What changed:  when to take this  reasons to take this   methotrexate 2.5 MG tablet Commonly known as:  RHEUMATREX Take 3 tablets by mouth once a week. Stopped prior to surgery   omeprazole 20 MG capsule Commonly known as:  PRILOSEC Take 1 capsule (20 mg total) by mouth daily. While taking anti inflammatory medicine daily   ondansetron 4 MG tablet Commonly known as:  ZOFRAN Take 1 tablet (4 mg total) by mouth every 8 (eight) hours as needed for nausea or vomiting.   OVER THE COUNTER MEDICATION Take 2 capsules by mouth 2 (two) times daily. Bio Tears  Vision supplement   oxyCODONE-acetaminophen 5-325 MG tablet Commonly known as:  ROXICET Take 1-2 tablets by mouth every 4 (four) hours as needed for severe pain.   SAMe 400 MG Tabs Take 400 mg by mouth daily.   VITAMIN B 12 PO Take 1 tablet by mouth daily.   VITAMIN D PO Take 2,000 Units by mouth daily.       Diagnostic Studies: Dg Chest 2 View  Result Date: 12/27/2015 CLINICAL DATA:  Pre-admission for hip surgery EXAM: CHEST  2 VIEW COMPARISON:  03/16/2015 FINDINGS: The heart size and mediastinal contours are within normal limits. Both lungs are clear. Mild degenerative changes mid and lower thoracic spine. IMPRESSION: No active cardiopulmonary disease. Electronically Signed   By: Lahoma Crocker M.D.   On: 12/27/2015 16:14   Dg C-arm 1-60 Min  Result Date: 01/07/2016 CLINICAL DATA:  Intraoperative radiographs for right hip arthroplasty. Initial encounter. EXAM: OPERATIVE RIGHT HIP (WITH PELVIS IF  PERFORMED) 1 VIEW TECHNIQUE: Fluoroscopic spot image(s) were submitted for interpretation post-operatively. COMPARISON:  Right hip radiographs performed 03/17/2015 FINDINGS: Two fluoroscopic C-arm images are provided from the OR, demonstrating placement of a right hip total arthroplasty. There is no evidence of loosening or new fracture. The soft tissues are not well assessed. IMPRESSION: Status post placement of right hip total arthroplasty. Electronically Signed   By: Garald Balding M.D.   On: 01/07/2016 23:36   Dg Hip Operative Unilat W Or W/o Pelvis Right  Result Date: 01/07/2016 CLINICAL DATA:  Intraoperative radiographs for right hip arthroplasty. Initial encounter. EXAM: OPERATIVE RIGHT HIP (WITH PELVIS IF PERFORMED) 1 VIEW TECHNIQUE: Fluoroscopic spot image(s) were submitted for interpretation post-operatively. COMPARISON:  Right hip radiographs performed 03/17/2015 FINDINGS: Two fluoroscopic C-arm images are provided from the OR, demonstrating placement of a right hip total arthroplasty. There is no evidence of loosening or new fracture. The soft tissues are not well assessed. IMPRESSION: Status post placement of right hip total arthroplasty. Electronically Signed   By: Garald Balding M.D.   On: 01/07/2016 23:36    Disposition: 01-Home or Self Care   Follow-up Information    MURPHY, TIMOTHY D, MD Follow up in 2 week(s).   Specialty:  Orthopedic Surgery Contact information: [address] [place] [address] [phone]           Signed: Prudencio Burly III PA-C 01/08/2016, 7:42 AM

## 2016-01-09 DIAGNOSIS — M069 Rheumatoid arthritis, unspecified: Secondary | ICD-10-CM | POA: Diagnosis not present

## 2016-01-09 DIAGNOSIS — Z471 Aftercare following joint replacement surgery: Secondary | ICD-10-CM | POA: Diagnosis not present

## 2016-01-09 DIAGNOSIS — Z8701 Personal history of pneumonia (recurrent): Secondary | ICD-10-CM | POA: Diagnosis not present

## 2016-01-09 DIAGNOSIS — E039 Hypothyroidism, unspecified: Secondary | ICD-10-CM | POA: Diagnosis not present

## 2016-01-09 DIAGNOSIS — Z96641 Presence of right artificial hip joint: Secondary | ICD-10-CM | POA: Diagnosis not present

## 2016-01-09 NOTE — Anesthesia Postprocedure Evaluation (Signed)
Anesthesia Post Note  Patient: Sheryl Foster  Procedure(s) Performed: Procedure(s) (LRB): RIGHT TOTAL HIP ARTHROPLASTY ANTERIOR APPROACH (Right)  Patient location during evaluation: PACU Anesthesia Type: General Level of consciousness: awake and alert and patient cooperative Pain management: pain level controlled Vital Signs Assessment: post-procedure vital signs reviewed and stable Respiratory status: spontaneous breathing and respiratory function stable Cardiovascular status: stable Anesthetic complications: no    Last Vitals:  Vitals:   01/08/16 0022 01/08/16 0539  BP: (!) 125/58 (!) 121/56  Pulse: 64 62  Resp: 16 16  Temp: 36.9 C 36.9 C    Last Pain:  Vitals:   01/08/16 1000  TempSrc:   PainSc: 0-No pain                 Kazimierz Springborn S

## 2016-01-15 NOTE — Addendum Note (Signed)
Addendum  created 01/15/16 1350 by Albertha Ghee, MD   Anesthesia Staff edited

## 2016-01-20 DIAGNOSIS — M1611 Unilateral primary osteoarthritis, right hip: Secondary | ICD-10-CM | POA: Diagnosis not present

## 2016-02-12 DIAGNOSIS — R262 Difficulty in walking, not elsewhere classified: Secondary | ICD-10-CM | POA: Diagnosis not present

## 2016-02-12 DIAGNOSIS — Z96641 Presence of right artificial hip joint: Secondary | ICD-10-CM | POA: Diagnosis not present

## 2016-02-12 DIAGNOSIS — R531 Weakness: Secondary | ICD-10-CM | POA: Diagnosis not present

## 2016-02-17 DIAGNOSIS — M8580 Other specified disorders of bone density and structure, unspecified site: Secondary | ICD-10-CM | POA: Diagnosis not present

## 2016-02-24 DIAGNOSIS — M1611 Unilateral primary osteoarthritis, right hip: Secondary | ICD-10-CM | POA: Diagnosis not present

## 2016-03-02 DIAGNOSIS — Z96641 Presence of right artificial hip joint: Secondary | ICD-10-CM | POA: Diagnosis not present

## 2016-03-02 DIAGNOSIS — R531 Weakness: Secondary | ICD-10-CM | POA: Diagnosis not present

## 2016-03-02 DIAGNOSIS — R262 Difficulty in walking, not elsewhere classified: Secondary | ICD-10-CM | POA: Diagnosis not present

## 2016-03-03 DIAGNOSIS — L814 Other melanin hyperpigmentation: Secondary | ICD-10-CM | POA: Diagnosis not present

## 2016-03-03 DIAGNOSIS — Z85828 Personal history of other malignant neoplasm of skin: Secondary | ICD-10-CM | POA: Diagnosis not present

## 2016-03-03 DIAGNOSIS — D2372 Other benign neoplasm of skin of left lower limb, including hip: Secondary | ICD-10-CM | POA: Diagnosis not present

## 2016-03-03 DIAGNOSIS — D2371 Other benign neoplasm of skin of right lower limb, including hip: Secondary | ICD-10-CM | POA: Diagnosis not present

## 2016-03-03 DIAGNOSIS — D1801 Hemangioma of skin and subcutaneous tissue: Secondary | ICD-10-CM | POA: Diagnosis not present

## 2016-03-03 DIAGNOSIS — L821 Other seborrheic keratosis: Secondary | ICD-10-CM | POA: Diagnosis not present

## 2016-03-03 DIAGNOSIS — L738 Other specified follicular disorders: Secondary | ICD-10-CM | POA: Diagnosis not present

## 2016-03-03 DIAGNOSIS — L820 Inflamed seborrheic keratosis: Secondary | ICD-10-CM | POA: Diagnosis not present

## 2016-03-03 DIAGNOSIS — D485 Neoplasm of uncertain behavior of skin: Secondary | ICD-10-CM | POA: Diagnosis not present

## 2016-03-06 DIAGNOSIS — Z79899 Other long term (current) drug therapy: Secondary | ICD-10-CM | POA: Diagnosis not present

## 2016-03-06 DIAGNOSIS — M0589 Other rheumatoid arthritis with rheumatoid factor of multiple sites: Secondary | ICD-10-CM | POA: Diagnosis not present

## 2016-03-06 DIAGNOSIS — M150 Primary generalized (osteo)arthritis: Secondary | ICD-10-CM | POA: Diagnosis not present

## 2016-03-06 DIAGNOSIS — M8580 Other specified disorders of bone density and structure, unspecified site: Secondary | ICD-10-CM | POA: Diagnosis not present

## 2016-03-12 DIAGNOSIS — Z96641 Presence of right artificial hip joint: Secondary | ICD-10-CM | POA: Diagnosis not present

## 2016-03-12 DIAGNOSIS — R262 Difficulty in walking, not elsewhere classified: Secondary | ICD-10-CM | POA: Diagnosis not present

## 2016-03-12 DIAGNOSIS — R531 Weakness: Secondary | ICD-10-CM | POA: Diagnosis not present

## 2016-06-04 DIAGNOSIS — M0589 Other rheumatoid arthritis with rheumatoid factor of multiple sites: Secondary | ICD-10-CM | POA: Diagnosis not present

## 2016-07-06 DIAGNOSIS — Z79899 Other long term (current) drug therapy: Secondary | ICD-10-CM | POA: Diagnosis not present

## 2016-07-06 DIAGNOSIS — M0589 Other rheumatoid arthritis with rheumatoid factor of multiple sites: Secondary | ICD-10-CM | POA: Diagnosis not present

## 2016-07-06 DIAGNOSIS — M150 Primary generalized (osteo)arthritis: Secondary | ICD-10-CM | POA: Diagnosis not present

## 2016-07-06 DIAGNOSIS — M8580 Other specified disorders of bone density and structure, unspecified site: Secondary | ICD-10-CM | POA: Diagnosis not present

## 2016-10-05 DIAGNOSIS — Z79899 Other long term (current) drug therapy: Secondary | ICD-10-CM | POA: Diagnosis not present

## 2016-10-05 DIAGNOSIS — M0589 Other rheumatoid arthritis with rheumatoid factor of multiple sites: Secondary | ICD-10-CM | POA: Diagnosis not present

## 2016-10-05 DIAGNOSIS — M79641 Pain in right hand: Secondary | ICD-10-CM | POA: Diagnosis not present

## 2016-10-05 DIAGNOSIS — M150 Primary generalized (osteo)arthritis: Secondary | ICD-10-CM | POA: Diagnosis not present

## 2016-10-05 DIAGNOSIS — M8580 Other specified disorders of bone density and structure, unspecified site: Secondary | ICD-10-CM | POA: Diagnosis not present

## 2016-10-05 DIAGNOSIS — M79642 Pain in left hand: Secondary | ICD-10-CM | POA: Diagnosis not present

## 2016-10-13 DIAGNOSIS — H52229 Regular astigmatism, unspecified eye: Secondary | ICD-10-CM | POA: Diagnosis not present

## 2016-10-13 DIAGNOSIS — H168 Other keratitis: Secondary | ICD-10-CM | POA: Diagnosis not present

## 2016-10-13 DIAGNOSIS — H524 Presbyopia: Secondary | ICD-10-CM | POA: Diagnosis not present

## 2016-10-13 DIAGNOSIS — H04123 Dry eye syndrome of bilateral lacrimal glands: Secondary | ICD-10-CM | POA: Diagnosis not present

## 2016-10-13 DIAGNOSIS — H2513 Age-related nuclear cataract, bilateral: Secondary | ICD-10-CM | POA: Diagnosis not present

## 2016-10-13 DIAGNOSIS — H5210 Myopia, unspecified eye: Secondary | ICD-10-CM | POA: Diagnosis not present

## 2016-11-23 DIAGNOSIS — E559 Vitamin D deficiency, unspecified: Secondary | ICD-10-CM | POA: Diagnosis not present

## 2016-11-23 DIAGNOSIS — E039 Hypothyroidism, unspecified: Secondary | ICD-10-CM | POA: Diagnosis not present

## 2016-11-23 DIAGNOSIS — M8580 Other specified disorders of bone density and structure, unspecified site: Secondary | ICD-10-CM | POA: Diagnosis not present

## 2016-11-23 DIAGNOSIS — Z0000 Encounter for general adult medical examination without abnormal findings: Secondary | ICD-10-CM | POA: Diagnosis not present

## 2016-11-26 DIAGNOSIS — R251 Tremor, unspecified: Secondary | ICD-10-CM | POA: Diagnosis not present

## 2016-11-26 DIAGNOSIS — M3500 Sicca syndrome, unspecified: Secondary | ICD-10-CM | POA: Diagnosis not present

## 2016-11-26 DIAGNOSIS — I8390 Asymptomatic varicose veins of unspecified lower extremity: Secondary | ICD-10-CM | POA: Diagnosis not present

## 2016-11-26 DIAGNOSIS — K5730 Diverticulosis of large intestine without perforation or abscess without bleeding: Secondary | ICD-10-CM | POA: Diagnosis not present

## 2016-11-26 DIAGNOSIS — Z6826 Body mass index (BMI) 26.0-26.9, adult: Secondary | ICD-10-CM | POA: Diagnosis not present

## 2016-11-26 DIAGNOSIS — E039 Hypothyroidism, unspecified: Secondary | ICD-10-CM | POA: Diagnosis not present

## 2016-11-26 DIAGNOSIS — I7300 Raynaud's syndrome without gangrene: Secondary | ICD-10-CM | POA: Diagnosis not present

## 2016-11-26 DIAGNOSIS — Z800 Family history of malignant neoplasm of digestive organs: Secondary | ICD-10-CM | POA: Diagnosis not present

## 2016-11-26 DIAGNOSIS — Z0000 Encounter for general adult medical examination without abnormal findings: Secondary | ICD-10-CM | POA: Diagnosis not present

## 2016-11-26 DIAGNOSIS — M87051 Idiopathic aseptic necrosis of right femur: Secondary | ICD-10-CM | POA: Diagnosis not present

## 2016-11-26 DIAGNOSIS — M8580 Other specified disorders of bone density and structure, unspecified site: Secondary | ICD-10-CM | POA: Diagnosis not present

## 2016-11-26 DIAGNOSIS — M0579 Rheumatoid arthritis with rheumatoid factor of multiple sites without organ or systems involvement: Secondary | ICD-10-CM | POA: Diagnosis not present

## 2016-12-08 DIAGNOSIS — Z1212 Encounter for screening for malignant neoplasm of rectum: Secondary | ICD-10-CM | POA: Diagnosis not present

## 2016-12-08 DIAGNOSIS — Z1211 Encounter for screening for malignant neoplasm of colon: Secondary | ICD-10-CM | POA: Diagnosis not present

## 2017-01-05 DIAGNOSIS — M150 Primary generalized (osteo)arthritis: Secondary | ICD-10-CM | POA: Diagnosis not present

## 2017-01-05 DIAGNOSIS — M8580 Other specified disorders of bone density and structure, unspecified site: Secondary | ICD-10-CM | POA: Diagnosis not present

## 2017-01-05 DIAGNOSIS — M0589 Other rheumatoid arthritis with rheumatoid factor of multiple sites: Secondary | ICD-10-CM | POA: Diagnosis not present

## 2017-01-05 DIAGNOSIS — Z79899 Other long term (current) drug therapy: Secondary | ICD-10-CM | POA: Diagnosis not present

## 2017-01-05 DIAGNOSIS — M1711 Unilateral primary osteoarthritis, right knee: Secondary | ICD-10-CM | POA: Diagnosis not present

## 2017-01-05 DIAGNOSIS — M25561 Pain in right knee: Secondary | ICD-10-CM | POA: Diagnosis not present

## 2017-03-04 DIAGNOSIS — Z85828 Personal history of other malignant neoplasm of skin: Secondary | ICD-10-CM | POA: Diagnosis not present

## 2017-03-04 DIAGNOSIS — D2372 Other benign neoplasm of skin of left lower limb, including hip: Secondary | ICD-10-CM | POA: Diagnosis not present

## 2017-03-04 DIAGNOSIS — D225 Melanocytic nevi of trunk: Secondary | ICD-10-CM | POA: Diagnosis not present

## 2017-03-04 DIAGNOSIS — L738 Other specified follicular disorders: Secondary | ICD-10-CM | POA: Diagnosis not present

## 2017-03-04 DIAGNOSIS — D1801 Hemangioma of skin and subcutaneous tissue: Secondary | ICD-10-CM | POA: Diagnosis not present

## 2017-03-04 DIAGNOSIS — D2371 Other benign neoplasm of skin of right lower limb, including hip: Secondary | ICD-10-CM | POA: Diagnosis not present

## 2017-03-04 DIAGNOSIS — L821 Other seborrheic keratosis: Secondary | ICD-10-CM | POA: Diagnosis not present

## 2017-03-04 DIAGNOSIS — L814 Other melanin hyperpigmentation: Secondary | ICD-10-CM | POA: Diagnosis not present

## 2017-04-07 DIAGNOSIS — M0589 Other rheumatoid arthritis with rheumatoid factor of multiple sites: Secondary | ICD-10-CM | POA: Diagnosis not present

## 2017-04-07 DIAGNOSIS — M8580 Other specified disorders of bone density and structure, unspecified site: Secondary | ICD-10-CM | POA: Diagnosis not present

## 2017-04-07 DIAGNOSIS — M79673 Pain in unspecified foot: Secondary | ICD-10-CM | POA: Diagnosis not present

## 2017-04-07 DIAGNOSIS — Z79899 Other long term (current) drug therapy: Secondary | ICD-10-CM | POA: Diagnosis not present

## 2017-04-07 DIAGNOSIS — M25561 Pain in right knee: Secondary | ICD-10-CM | POA: Diagnosis not present

## 2017-04-07 DIAGNOSIS — M25539 Pain in unspecified wrist: Secondary | ICD-10-CM | POA: Diagnosis not present

## 2017-04-07 DIAGNOSIS — M1711 Unilateral primary osteoarthritis, right knee: Secondary | ICD-10-CM | POA: Diagnosis not present

## 2017-04-07 DIAGNOSIS — M150 Primary generalized (osteo)arthritis: Secondary | ICD-10-CM | POA: Diagnosis not present

## 2017-07-06 DIAGNOSIS — M150 Primary generalized (osteo)arthritis: Secondary | ICD-10-CM | POA: Diagnosis not present

## 2017-07-06 DIAGNOSIS — M25561 Pain in right knee: Secondary | ICD-10-CM | POA: Diagnosis not present

## 2017-07-06 DIAGNOSIS — Z79899 Other long term (current) drug therapy: Secondary | ICD-10-CM | POA: Diagnosis not present

## 2017-07-06 DIAGNOSIS — M8580 Other specified disorders of bone density and structure, unspecified site: Secondary | ICD-10-CM | POA: Diagnosis not present

## 2017-07-06 DIAGNOSIS — M0589 Other rheumatoid arthritis with rheumatoid factor of multiple sites: Secondary | ICD-10-CM | POA: Diagnosis not present

## 2017-07-06 DIAGNOSIS — M1711 Unilateral primary osteoarthritis, right knee: Secondary | ICD-10-CM | POA: Diagnosis not present

## 2017-07-20 ENCOUNTER — Ambulatory Visit
Admission: RE | Admit: 2017-07-20 | Discharge: 2017-07-20 | Disposition: A | Discharge status: Home / Self Care | Payer: PPO | Source: Ambulatory Visit | Attending: Internal Medicine | Admitting: Internal Medicine

## 2017-07-20 ENCOUNTER — Other Ambulatory Visit: Payer: Self-pay | Admitting: Internal Medicine

## 2017-07-20 DIAGNOSIS — Z1231 Encounter for screening mammogram for malignant neoplasm of breast: Secondary | ICD-10-CM

## 2017-08-02 DIAGNOSIS — M0589 Other rheumatoid arthritis with rheumatoid factor of multiple sites: Secondary | ICD-10-CM | POA: Diagnosis not present

## 2017-08-03 DIAGNOSIS — J40 Bronchitis, not specified as acute or chronic: Secondary | ICD-10-CM | POA: Diagnosis not present

## 2017-08-16 DIAGNOSIS — M0589 Other rheumatoid arthritis with rheumatoid factor of multiple sites: Secondary | ICD-10-CM | POA: Diagnosis not present

## 2017-08-25 DIAGNOSIS — M0589 Other rheumatoid arthritis with rheumatoid factor of multiple sites: Secondary | ICD-10-CM | POA: Diagnosis not present

## 2017-08-25 DIAGNOSIS — M25561 Pain in right knee: Secondary | ICD-10-CM | POA: Diagnosis not present

## 2017-08-25 DIAGNOSIS — Z79899 Other long term (current) drug therapy: Secondary | ICD-10-CM | POA: Diagnosis not present

## 2017-08-25 DIAGNOSIS — M150 Primary generalized (osteo)arthritis: Secondary | ICD-10-CM | POA: Diagnosis not present

## 2017-08-25 DIAGNOSIS — M1711 Unilateral primary osteoarthritis, right knee: Secondary | ICD-10-CM | POA: Diagnosis not present

## 2017-08-25 DIAGNOSIS — M8580 Other specified disorders of bone density and structure, unspecified site: Secondary | ICD-10-CM | POA: Diagnosis not present

## 2017-08-25 DIAGNOSIS — R748 Abnormal levels of other serum enzymes: Secondary | ICD-10-CM | POA: Diagnosis not present

## 2017-09-08 DIAGNOSIS — Z79899 Other long term (current) drug therapy: Secondary | ICD-10-CM | POA: Diagnosis not present

## 2017-10-06 DIAGNOSIS — M25561 Pain in right knee: Secondary | ICD-10-CM | POA: Diagnosis not present

## 2017-10-06 DIAGNOSIS — M0589 Other rheumatoid arthritis with rheumatoid factor of multiple sites: Secondary | ICD-10-CM | POA: Diagnosis not present

## 2017-10-06 DIAGNOSIS — M1711 Unilateral primary osteoarthritis, right knee: Secondary | ICD-10-CM | POA: Diagnosis not present

## 2017-10-06 DIAGNOSIS — M150 Primary generalized (osteo)arthritis: Secondary | ICD-10-CM | POA: Diagnosis not present

## 2017-10-06 DIAGNOSIS — M8580 Other specified disorders of bone density and structure, unspecified site: Secondary | ICD-10-CM | POA: Diagnosis not present

## 2017-10-06 DIAGNOSIS — Z79899 Other long term (current) drug therapy: Secondary | ICD-10-CM | POA: Diagnosis not present

## 2017-10-06 DIAGNOSIS — R748 Abnormal levels of other serum enzymes: Secondary | ICD-10-CM | POA: Diagnosis not present

## 2017-11-26 DIAGNOSIS — E559 Vitamin D deficiency, unspecified: Secondary | ICD-10-CM | POA: Diagnosis not present

## 2017-11-26 DIAGNOSIS — M8580 Other specified disorders of bone density and structure, unspecified site: Secondary | ICD-10-CM | POA: Diagnosis not present

## 2017-11-26 DIAGNOSIS — E039 Hypothyroidism, unspecified: Secondary | ICD-10-CM | POA: Diagnosis not present

## 2017-12-01 DIAGNOSIS — E039 Hypothyroidism, unspecified: Secondary | ICD-10-CM | POA: Diagnosis not present

## 2017-12-01 DIAGNOSIS — M8580 Other specified disorders of bone density and structure, unspecified site: Secondary | ICD-10-CM | POA: Diagnosis not present

## 2017-12-01 DIAGNOSIS — M3500 Sicca syndrome, unspecified: Secondary | ICD-10-CM | POA: Diagnosis not present

## 2017-12-01 DIAGNOSIS — K5730 Diverticulosis of large intestine without perforation or abscess without bleeding: Secondary | ICD-10-CM | POA: Diagnosis not present

## 2017-12-01 DIAGNOSIS — M0579 Rheumatoid arthritis with rheumatoid factor of multiple sites without organ or systems involvement: Secondary | ICD-10-CM | POA: Diagnosis not present

## 2017-12-01 DIAGNOSIS — Z0000 Encounter for general adult medical examination without abnormal findings: Secondary | ICD-10-CM | POA: Diagnosis not present

## 2017-12-01 DIAGNOSIS — Z6826 Body mass index (BMI) 26.0-26.9, adult: Secondary | ICD-10-CM | POA: Diagnosis not present

## 2017-12-01 DIAGNOSIS — R251 Tremor, unspecified: Secondary | ICD-10-CM | POA: Diagnosis not present

## 2017-12-01 DIAGNOSIS — Z23 Encounter for immunization: Secondary | ICD-10-CM | POA: Diagnosis not present

## 2017-12-01 DIAGNOSIS — R0789 Other chest pain: Secondary | ICD-10-CM | POA: Diagnosis not present

## 2017-12-01 DIAGNOSIS — I7300 Raynaud's syndrome without gangrene: Secondary | ICD-10-CM | POA: Diagnosis not present

## 2017-12-01 DIAGNOSIS — M810 Age-related osteoporosis without current pathological fracture: Secondary | ICD-10-CM | POA: Diagnosis not present

## 2018-01-04 ENCOUNTER — Ambulatory Visit: Payer: PPO | Admitting: Cardiovascular Disease

## 2018-01-07 ENCOUNTER — Encounter: Payer: Self-pay | Admitting: Cardiovascular Disease

## 2018-01-07 ENCOUNTER — Ambulatory Visit (INDEPENDENT_AMBULATORY_CARE_PROVIDER_SITE_OTHER): Payer: PPO | Admitting: Cardiovascular Disease

## 2018-01-07 VITALS — BP 120/70 | HR 55 | Ht 65.5 in | Wt 160.0 lb

## 2018-01-07 DIAGNOSIS — R072 Precordial pain: Secondary | ICD-10-CM

## 2018-01-07 DIAGNOSIS — R079 Chest pain, unspecified: Secondary | ICD-10-CM | POA: Insufficient documentation

## 2018-01-07 DIAGNOSIS — I208 Other forms of angina pectoris: Secondary | ICD-10-CM | POA: Diagnosis not present

## 2018-01-07 NOTE — Addendum Note (Signed)
Addended by: Cristopher Estimable on: 01/07/2018 04:10 PM   Modules accepted: Orders

## 2018-01-07 NOTE — Patient Instructions (Signed)
Medication Instructions:  NO CHANGE If you need a refill on your cardiac medications before your next appointment, please call your pharmacy.   Lab work: If you have labs (blood work) drawn today and your tests are completely normal, you will receive your results only by: Marland Kitchen MyChart Message (if you have MyChart) OR . A paper copy in the mail If you have any lab test that is abnormal or we need to change your treatment, we will call you to review the results.  Testing/Procedures: Your physician has requested that you have en exercise stress myoview. For further information please visit [email or web address]. Please follow instruction sheet, as given.    Follow-Up: Your physician recommends that you schedule a follow-up appointment in: AS NEEDED PENDING TEST RESULTS

## 2018-01-07 NOTE — Progress Notes (Signed)
01/07/2018 [place]   1945/06/06  [id number]  Primary Physician Deland Pretty, MD Primary Cardiologist: Lorretta Harp MD Lupe Car[place], [place]  HPI:  Sheryl Foster is a 72 y.o. mildly overweight divorced Caucasian female mother of 2, grandmother for grandchildren who is retired physical therapist and was referred by Dr. Shelia Media for cardia vascular evaluation because of exertional chest pain.  Her only risk factor is family history of father had bypass surgery at age 68.  She is never had a heart attack or stroke.  She does have rheumatoid arthritis and Sjogren's syndrome.  She walks in the mornings with friends roughly 2 miles at a time and is noticed new onset left upper chest discomfort with walking over the last 4 months.  The pain does not radiate and resolve spontaneously when she stops walking.   Current Meds  Medication Sig  . acetaminophen (TYLENOL) 500 MG tablet Take 500 mg by mouth every 6 (six) hours as needed for mild pain.  Marland Kitchen aspirin EC 325 MG tablet Take 1 tablet (325 mg total) by mouth daily.  Marland Kitchen CALCIUM-VITAMIN D PO Take 1 capsule by mouth daily.  . Cholecalciferol (VITAMIN D PO) Take 2,000 Units by mouth daily.   . Cyanocobalamin (VITAMIN B 12 PO) Take 1 tablet by mouth daily.  Marland Kitchen docusate sodium (COLACE) 100 MG capsule Take 1 capsule (100 mg total) by mouth 2 (two) times daily. To prevent constipation while taking pain medication.  . folic acid (FOLVITE) 1 MG tablet Take 1 mg by mouth daily.   . Glucosamine HCl (GLUCOSAMINE PO) Take 1 capsule by mouth daily.  Marland Kitchen levothyroxine (SYNTHROID, LEVOTHROID) 88 MCG tablet Take 1 tablet by mouth daily.  . meloxicam (MOBIC) 15 MG tablet Take 7.5 mg by mouth daily as needed.   . methocarbamol (ROBAXIN) 500 MG tablet Take 1 tablet (500 mg total) by mouth every 6 (six) hours as needed for muscle spasms.  . methotrexate (RHEUMATREX) 2.5 MG tablet Take 3 tablets by mouth once a week. Stopped prior to surgery    . Omega-3 Fatty Acids (FISH OIL PO) Take 1 capsule by mouth 2 (two) times daily.  Marland Kitchen omeprazole (PRILOSEC) 20 MG capsule Take 1 capsule (20 mg total) by mouth daily. While taking anti inflammatory medicine daily  . ondansetron (ZOFRAN) 4 MG tablet Take 1 tablet (4 mg total) by mouth every 8 (eight) hours as needed for nausea or vomiting.  Marland Kitchen OVER THE COUNTER MEDICATION Take 2 capsules by mouth 2 (two) times daily. Bio Tears  Vision supplement  . oxyCODONE-acetaminophen (ROXICET) 5-325 MG tablet Take 1-2 tablets by mouth every 4 (four) hours as needed for severe pain.  . S-Adenosylmethionine (SAME) 400 MG TABS Take 400 mg by mouth daily.     Allergies  Allergen Reactions  . Tobramycin Swelling    Swelling and redness in eye    Social History   Socioeconomic History  . Marital status: Single    Spouse name: Not on file  . Number of children: Not on file  . Years of education: Not on file  . Highest education level: Not on file  Occupational History  . Not on file  Social Needs  . Financial resource strain: Not on file  . Food insecurity:    Worry: Not on file    Inability: Not on file  . Transportation needs:    Medical: Not on file    Non-medical: Not on file  Tobacco Use  .  Smoking status: Never Smoker  . Smokeless tobacco: Never Used  Substance and Sexual Activity  . Alcohol use: Yes    Alcohol/week: 7.0 standard drinks    Types: 7 Glasses of wine per week  . Drug use: No  . Sexual activity: Not on file  Lifestyle  . Physical activity:    Days per week: Not on file    Minutes per session: Not on file  . Stress: Not on file  Relationships  . Social connections:    Talks on phone: Not on file    Gets together: Not on file    Attends religious service: Not on file    Active member of club or organization: Not on file    Attends meetings of clubs or organizations: Not on file    Relationship status: Not on file  . Intimate partner violence:    Fear of current or  ex partner: Not on file    Emotionally abused: Not on file    Physically abused: Not on file    Forced sexual activity: Not on file  Other Topics Concern  . Not on file  Social History Narrative  . Not on file     Review of Systems: General: negative for chills, fever, night sweats or weight changes.  Cardiovascular: negative for chest pain, dyspnea on exertion, edema, orthopnea, palpitations, paroxysmal nocturnal dyspnea or shortness of breath Dermatological: negative for rash Respiratory: negative for cough or wheezing Urologic: negative for hematuria Abdominal: negative for nausea, vomiting, diarrhea, bright red blood per rectum, melena, or hematemesis Neurologic: negative for visual changes, syncope, or dizziness All other systems reviewed and are otherwise negative except as noted above.    Blood pressure 120/70, pulse (!) 55, height 5' 5.5" (1.664 m), weight 160 lb (72.6 kg).  General appearance: alert and no distress Neck: no adenopathy, no carotid bruit, no JVD, supple, symmetrical, trachea midline and thyroid not enlarged, symmetric, no tenderness/mass/nodules Lungs: clear to auscultation bilaterally Heart: regular rate and rhythm, S1, S2 normal, no murmur, click, rub or gallop Extremities: extremities normal, atraumatic, no cyanosis or edema Pulses: 2+ and symmetric Skin: Skin color, texture, turgor normal. No rashes or lesions Neurologic: Alert and oriented X 3, normal strength and tone. Normal symmetric reflexes. Normal coordination and gait  EKG sinus bradycardia 55 without ST or T wave changes.  Personally reviewed this EKG.  ASSESSMENT AND PLAN:   Chest pain Ms Calame was referred by Dr. Shelia Media for new onset exertional chest pain.  She has no cardiac risk factors other than a father who had bypass surgery at age 36.  She is never had a heart attack or stroke.  She works out at [employer] and walks several days a week with friends.  She is noticed in the  last 4 months during her morning walks she developed some left chest pain which resolves when she stops walking.  Based on this, I am going to get exercise Myoview stress test to further evaluate.      Lorretta Harp MD FACP,FACC,FAHA, [facility] 01/07/2018 4:01 PM

## 2018-01-07 NOTE — Assessment & Plan Note (Signed)
Sheryl Foster was referred by Dr. Shelia Media for new onset exertional chest pain.  She has no cardiac risk factors other than a father who had bypass surgery at age 73.  She is never had a heart attack or stroke.  She works out at [employer] and walks several days a week with friends.  She is noticed in the last 4 months during her morning walks she developed some left chest pain which resolves when she stops walking.  Based on this, I am going to get exercise Myoview stress test to further evaluate.

## 2018-01-10 DIAGNOSIS — M810 Age-related osteoporosis without current pathological fracture: Secondary | ICD-10-CM | POA: Diagnosis not present

## 2018-01-12 ENCOUNTER — Telehealth (HOSPITAL_COMMUNITY): Payer: Self-pay

## 2018-01-12 DIAGNOSIS — M810 Age-related osteoporosis without current pathological fracture: Secondary | ICD-10-CM | POA: Diagnosis not present

## 2018-01-12 NOTE — Telephone Encounter (Signed)
Encounter complete. 

## 2018-01-14 ENCOUNTER — Ambulatory Visit (HOSPITAL_COMMUNITY)
Admission: RE | Admit: 2018-01-14 | Discharge: 2018-01-14 | Disposition: A | Discharge status: Home / Self Care | Payer: PPO | Source: Ambulatory Visit | Attending: Cardiovascular Disease | Admitting: Cardiovascular Disease

## 2018-01-14 DIAGNOSIS — R072 Precordial pain: Secondary | ICD-10-CM | POA: Insufficient documentation

## 2018-01-14 LAB — MYOCARDIAL PERFUSION IMAGING
CHL CUP NUCLEAR SDS: 1
CHL CUP NUCLEAR SRS: 0
CHL CUP RESTING HR STRESS: 51 {beats}/min
CSEPED: 7 min
CSEPHR: 112 %
CSEPPHR: 166 {beats}/min
Estimated workload: 9.7 METS
Exercise duration (sec): 45 s
LV sys vol: 19 mL
LVDIAVOL: 57 mL (ref 46–106)
MPHR: 147 {beats}/min
RPE: 17
SSS: 1
TID: 0.92

## 2018-01-14 MED ORDER — TECHNETIUM TC 99M TETROFOSMIN IV KIT
10.8000 | PACK | Freq: Once | INTRAVENOUS | Status: AC | PRN
Start: 2018-01-14 — End: 2018-01-14
  Administered 2018-01-14: 10.8 via INTRAVENOUS
  Filled 2018-01-14: qty 11

## 2018-01-14 MED ORDER — TECHNETIUM TC 99M TETROFOSMIN IV KIT
31.5000 | PACK | Freq: Once | INTRAVENOUS | Status: AC | PRN
  Administered 2018-01-14: 31.5 via INTRAVENOUS
  Filled 2018-01-14: qty 32

## 2018-01-19 DIAGNOSIS — M1711 Unilateral primary osteoarthritis, right knee: Secondary | ICD-10-CM | POA: Diagnosis not present

## 2018-01-19 DIAGNOSIS — M810 Age-related osteoporosis without current pathological fracture: Secondary | ICD-10-CM | POA: Diagnosis not present

## 2018-01-19 DIAGNOSIS — R748 Abnormal levels of other serum enzymes: Secondary | ICD-10-CM | POA: Diagnosis not present

## 2018-01-19 DIAGNOSIS — M150 Primary generalized (osteo)arthritis: Secondary | ICD-10-CM | POA: Diagnosis not present

## 2018-01-19 DIAGNOSIS — M25561 Pain in right knee: Secondary | ICD-10-CM | POA: Diagnosis not present

## 2018-01-19 DIAGNOSIS — Z79899 Other long term (current) drug therapy: Secondary | ICD-10-CM | POA: Diagnosis not present

## 2018-01-19 DIAGNOSIS — M0589 Other rheumatoid arthritis with rheumatoid factor of multiple sites: Secondary | ICD-10-CM | POA: Diagnosis not present

## 2018-04-21 DIAGNOSIS — M0589 Other rheumatoid arthritis with rheumatoid factor of multiple sites: Secondary | ICD-10-CM | POA: Diagnosis not present

## 2018-04-21 DIAGNOSIS — M810 Age-related osteoporosis without current pathological fracture: Secondary | ICD-10-CM | POA: Diagnosis not present

## 2018-04-21 DIAGNOSIS — M25561 Pain in right knee: Secondary | ICD-10-CM | POA: Diagnosis not present

## 2018-04-21 DIAGNOSIS — M1711 Unilateral primary osteoarthritis, right knee: Secondary | ICD-10-CM | POA: Diagnosis not present

## 2018-04-21 DIAGNOSIS — Z79899 Other long term (current) drug therapy: Secondary | ICD-10-CM | POA: Diagnosis not present

## 2018-04-21 DIAGNOSIS — R748 Abnormal levels of other serum enzymes: Secondary | ICD-10-CM | POA: Diagnosis not present

## 2018-04-21 DIAGNOSIS — M150 Primary generalized (osteo)arthritis: Secondary | ICD-10-CM | POA: Diagnosis not present

## 2018-06-01 DIAGNOSIS — H1850 Unspecified hereditary corneal dystrophies: Secondary | ICD-10-CM | POA: Diagnosis not present

## 2018-06-01 DIAGNOSIS — H25813 Combined forms of age-related cataract, bilateral: Secondary | ICD-10-CM | POA: Diagnosis not present

## 2018-06-01 DIAGNOSIS — H52223 Regular astigmatism, bilateral: Secondary | ICD-10-CM | POA: Diagnosis not present

## 2018-06-01 DIAGNOSIS — H16142 Punctate keratitis, left eye: Secondary | ICD-10-CM | POA: Diagnosis not present

## 2018-06-01 DIAGNOSIS — H5213 Myopia, bilateral: Secondary | ICD-10-CM | POA: Diagnosis not present

## 2018-07-21 DIAGNOSIS — M150 Primary generalized (osteo)arthritis: Secondary | ICD-10-CM | POA: Diagnosis not present

## 2018-07-21 DIAGNOSIS — M25561 Pain in right knee: Secondary | ICD-10-CM | POA: Diagnosis not present

## 2018-07-21 DIAGNOSIS — Z79899 Other long term (current) drug therapy: Secondary | ICD-10-CM | POA: Diagnosis not present

## 2018-07-21 DIAGNOSIS — M810 Age-related osteoporosis without current pathological fracture: Secondary | ICD-10-CM | POA: Diagnosis not present

## 2018-07-21 DIAGNOSIS — M1711 Unilateral primary osteoarthritis, right knee: Secondary | ICD-10-CM | POA: Diagnosis not present

## 2018-07-21 DIAGNOSIS — M0589 Other rheumatoid arthritis with rheumatoid factor of multiple sites: Secondary | ICD-10-CM | POA: Diagnosis not present

## 2018-08-24 ENCOUNTER — Other Ambulatory Visit: Payer: Self-pay | Admitting: Internal Medicine

## 2018-08-24 DIAGNOSIS — Z1231 Encounter for screening mammogram for malignant neoplasm of breast: Secondary | ICD-10-CM

## 2018-10-04 DIAGNOSIS — H16142 Punctate keratitis, left eye: Secondary | ICD-10-CM | POA: Diagnosis not present

## 2018-10-04 DIAGNOSIS — H5213 Myopia, bilateral: Secondary | ICD-10-CM | POA: Diagnosis not present

## 2018-10-04 DIAGNOSIS — H52223 Regular astigmatism, bilateral: Secondary | ICD-10-CM | POA: Diagnosis not present

## 2018-10-04 DIAGNOSIS — H524 Presbyopia: Secondary | ICD-10-CM | POA: Diagnosis not present

## 2018-10-04 DIAGNOSIS — H16122 Filamentary keratitis, left eye: Secondary | ICD-10-CM | POA: Diagnosis not present

## 2018-10-07 DIAGNOSIS — H16129 Filamentary keratitis, unspecified eye: Secondary | ICD-10-CM | POA: Diagnosis not present

## 2018-10-07 DIAGNOSIS — R1030 Lower abdominal pain, unspecified: Secondary | ICD-10-CM | POA: Diagnosis not present

## 2018-10-13 ENCOUNTER — Ambulatory Visit
Admission: RE | Admit: 2018-10-13 | Discharge: 2018-10-13 | Disposition: A | Discharge status: Home / Self Care | Payer: PPO | Source: Ambulatory Visit | Attending: Internal Medicine | Admitting: Internal Medicine

## 2018-10-13 ENCOUNTER — Other Ambulatory Visit: Payer: Self-pay

## 2018-10-13 DIAGNOSIS — Z1231 Encounter for screening mammogram for malignant neoplasm of breast: Secondary | ICD-10-CM

## 2018-10-20 DIAGNOSIS — M810 Age-related osteoporosis without current pathological fracture: Secondary | ICD-10-CM | POA: Diagnosis not present

## 2018-10-20 DIAGNOSIS — M1711 Unilateral primary osteoarthritis, right knee: Secondary | ICD-10-CM | POA: Diagnosis not present

## 2018-10-20 DIAGNOSIS — M150 Primary generalized (osteo)arthritis: Secondary | ICD-10-CM | POA: Diagnosis not present

## 2018-10-20 DIAGNOSIS — M25561 Pain in right knee: Secondary | ICD-10-CM | POA: Diagnosis not present

## 2018-10-20 DIAGNOSIS — Z79899 Other long term (current) drug therapy: Secondary | ICD-10-CM | POA: Diagnosis not present

## 2018-10-20 DIAGNOSIS — M0589 Other rheumatoid arthritis with rheumatoid factor of multiple sites: Secondary | ICD-10-CM | POA: Diagnosis not present

## 2018-11-08 DIAGNOSIS — L814 Other melanin hyperpigmentation: Secondary | ICD-10-CM | POA: Diagnosis not present

## 2018-11-08 DIAGNOSIS — L821 Other seborrheic keratosis: Secondary | ICD-10-CM | POA: Diagnosis not present

## 2018-11-08 DIAGNOSIS — D1801 Hemangioma of skin and subcutaneous tissue: Secondary | ICD-10-CM | POA: Diagnosis not present

## 2018-11-08 DIAGNOSIS — D2372 Other benign neoplasm of skin of left lower limb, including hip: Secondary | ICD-10-CM | POA: Diagnosis not present

## 2018-11-08 DIAGNOSIS — Z85828 Personal history of other malignant neoplasm of skin: Secondary | ICD-10-CM | POA: Diagnosis not present

## 2018-11-08 DIAGNOSIS — L738 Other specified follicular disorders: Secondary | ICD-10-CM | POA: Diagnosis not present

## 2018-11-08 DIAGNOSIS — L603 Nail dystrophy: Secondary | ICD-10-CM | POA: Diagnosis not present

## 2018-11-29 DIAGNOSIS — M8580 Other specified disorders of bone density and structure, unspecified site: Secondary | ICD-10-CM | POA: Diagnosis not present

## 2018-11-29 DIAGNOSIS — Z833 Family history of diabetes mellitus: Secondary | ICD-10-CM | POA: Diagnosis not present

## 2018-11-29 DIAGNOSIS — E039 Hypothyroidism, unspecified: Secondary | ICD-10-CM | POA: Diagnosis not present

## 2018-11-29 DIAGNOSIS — E559 Vitamin D deficiency, unspecified: Secondary | ICD-10-CM | POA: Diagnosis not present

## 2018-12-06 DIAGNOSIS — M3500 Sicca syndrome, unspecified: Secondary | ICD-10-CM | POA: Diagnosis not present

## 2018-12-06 DIAGNOSIS — Z800 Family history of malignant neoplasm of digestive organs: Secondary | ICD-10-CM | POA: Diagnosis not present

## 2018-12-06 DIAGNOSIS — I7300 Raynaud's syndrome without gangrene: Secondary | ICD-10-CM | POA: Diagnosis not present

## 2018-12-06 DIAGNOSIS — M810 Age-related osteoporosis without current pathological fracture: Secondary | ICD-10-CM | POA: Diagnosis not present

## 2018-12-06 DIAGNOSIS — R251 Tremor, unspecified: Secondary | ICD-10-CM | POA: Diagnosis not present

## 2018-12-06 DIAGNOSIS — Z833 Family history of diabetes mellitus: Secondary | ICD-10-CM | POA: Diagnosis not present

## 2018-12-06 DIAGNOSIS — K5730 Diverticulosis of large intestine without perforation or abscess without bleeding: Secondary | ICD-10-CM | POA: Diagnosis not present

## 2018-12-06 DIAGNOSIS — Z01419 Encounter for gynecological examination (general) (routine) without abnormal findings: Secondary | ICD-10-CM | POA: Diagnosis not present

## 2018-12-06 DIAGNOSIS — E039 Hypothyroidism, unspecified: Secondary | ICD-10-CM | POA: Diagnosis not present

## 2018-12-06 DIAGNOSIS — Z0000 Encounter for general adult medical examination without abnormal findings: Secondary | ICD-10-CM | POA: Diagnosis not present

## 2018-12-06 DIAGNOSIS — M0579 Rheumatoid arthritis with rheumatoid factor of multiple sites without organ or systems involvement: Secondary | ICD-10-CM | POA: Diagnosis not present

## 2018-12-06 DIAGNOSIS — Z23 Encounter for immunization: Secondary | ICD-10-CM | POA: Diagnosis not present

## 2018-12-06 DIAGNOSIS — I208 Other forms of angina pectoris: Secondary | ICD-10-CM | POA: Diagnosis not present

## 2018-12-15 DIAGNOSIS — H16122 Filamentary keratitis, left eye: Secondary | ICD-10-CM | POA: Diagnosis not present

## 2018-12-15 DIAGNOSIS — M3501 Sicca syndrome with keratoconjunctivitis: Secondary | ICD-10-CM | POA: Diagnosis not present

## 2018-12-22 DIAGNOSIS — M3501 Sicca syndrome with keratoconjunctivitis: Secondary | ICD-10-CM | POA: Diagnosis not present

## 2018-12-22 DIAGNOSIS — H16122 Filamentary keratitis, left eye: Secondary | ICD-10-CM | POA: Diagnosis not present

## 2019-01-12 DIAGNOSIS — H16122 Filamentary keratitis, left eye: Secondary | ICD-10-CM | POA: Diagnosis not present

## 2019-01-18 DIAGNOSIS — M19042 Primary osteoarthritis, left hand: Secondary | ICD-10-CM | POA: Diagnosis not present

## 2019-01-18 DIAGNOSIS — M79642 Pain in left hand: Secondary | ICD-10-CM | POA: Diagnosis not present

## 2019-01-18 DIAGNOSIS — M79641 Pain in right hand: Secondary | ICD-10-CM | POA: Diagnosis not present

## 2019-01-18 DIAGNOSIS — M0589 Other rheumatoid arthritis with rheumatoid factor of multiple sites: Secondary | ICD-10-CM | POA: Diagnosis not present

## 2019-01-18 DIAGNOSIS — H04129 Dry eye syndrome of unspecified lacrimal gland: Secondary | ICD-10-CM | POA: Diagnosis not present

## 2019-01-18 DIAGNOSIS — M1711 Unilateral primary osteoarthritis, right knee: Secondary | ICD-10-CM | POA: Diagnosis not present

## 2019-01-18 DIAGNOSIS — M19041 Primary osteoarthritis, right hand: Secondary | ICD-10-CM | POA: Diagnosis not present

## 2019-01-18 DIAGNOSIS — Z79899 Other long term (current) drug therapy: Secondary | ICD-10-CM | POA: Diagnosis not present

## 2019-01-18 DIAGNOSIS — M810 Age-related osteoporosis without current pathological fracture: Secondary | ICD-10-CM | POA: Diagnosis not present

## 2019-01-18 DIAGNOSIS — M150 Primary generalized (osteo)arthritis: Secondary | ICD-10-CM | POA: Diagnosis not present

## 2019-01-18 DIAGNOSIS — M25561 Pain in right knee: Secondary | ICD-10-CM | POA: Diagnosis not present

## 2019-01-26 DIAGNOSIS — M810 Age-related osteoporosis without current pathological fracture: Secondary | ICD-10-CM | POA: Diagnosis not present

## 2019-02-16 DIAGNOSIS — H16122 Filamentary keratitis, left eye: Secondary | ICD-10-CM | POA: Diagnosis not present

## 2019-04-20 ENCOUNTER — Ambulatory Visit: Payer: PPO | Attending: Internal Medicine

## 2019-04-20 DIAGNOSIS — Z79899 Other long term (current) drug therapy: Secondary | ICD-10-CM | POA: Diagnosis not present

## 2019-04-20 DIAGNOSIS — Z23 Encounter for immunization: Secondary | ICD-10-CM | POA: Insufficient documentation

## 2019-04-20 DIAGNOSIS — M0589 Other rheumatoid arthritis with rheumatoid factor of multiple sites: Secondary | ICD-10-CM | POA: Diagnosis not present

## 2019-04-20 DIAGNOSIS — M25561 Pain in right knee: Secondary | ICD-10-CM | POA: Diagnosis not present

## 2019-04-20 DIAGNOSIS — M810 Age-related osteoporosis without current pathological fracture: Secondary | ICD-10-CM | POA: Diagnosis not present

## 2019-04-20 DIAGNOSIS — M150 Primary generalized (osteo)arthritis: Secondary | ICD-10-CM | POA: Diagnosis not present

## 2019-04-20 DIAGNOSIS — M1711 Unilateral primary osteoarthritis, right knee: Secondary | ICD-10-CM | POA: Diagnosis not present

## 2019-04-20 DIAGNOSIS — H04129 Dry eye syndrome of unspecified lacrimal gland: Secondary | ICD-10-CM | POA: Diagnosis not present

## 2019-04-20 NOTE — Progress Notes (Signed)
   Covid-19 Vaccination Clinic  Name:  Sheryl Foster    MRN: [id number] DOB: 1946-03-14  04/20/2019  [name] was observed post Covid-19 immunization for 15 minutes without incidence. She was provided with Vaccine Information Sheet and instruction to access the V-Safe system.   [name] was instructed to call 911 with any severe reactions post vaccine: Marland Kitchen Difficulty breathing  . Swelling of your face and throat  . A fast heartbeat  . A bad rash all over your body  . Dizziness and weakness    Immunizations Administered    Name Date Dose VIS Date Route   Pfizer COVID-19 Vaccine 04/20/2019  6:10 PM 0.3 mL 03/10/2019 Intramuscular   Manufacturer: [place]   Lot: [id number]   [place]: [id number]

## 2019-05-11 ENCOUNTER — Ambulatory Visit: Payer: PPO | Attending: Internal Medicine

## 2019-05-11 DIAGNOSIS — Z23 Encounter for immunization: Secondary | ICD-10-CM | POA: Insufficient documentation

## 2019-05-11 NOTE — Progress Notes (Signed)
   Covid-19 Vaccination Clinic  Name:  Sheryl Foster    MRN: [id number] DOB: Aug 04, 1945  05/11/2019  [name] was observed post Covid-19 immunization for 15 minutes without incidence. She was provided with Vaccine Information Sheet and instruction to access the V-Safe system.   [name] was instructed to call 911 with any severe reactions post vaccine: Marland Kitchen Difficulty breathing  . Swelling of your face and throat  . A fast heartbeat  . A bad rash all over your body  . Dizziness and weakness    Immunizations Administered    Name Date Dose VIS Date Route   Pfizer COVID-19 Vaccine 05/11/2019 10:49 AM 0.3 mL 03/10/2019 Intramuscular   Manufacturer: [place]   Lot: [id number]   [place]: [id number]

## 2019-05-31 DIAGNOSIS — M1611 Unilateral primary osteoarthritis, right hip: Secondary | ICD-10-CM | POA: Diagnosis not present

## 2019-06-01 DIAGNOSIS — S76011D Strain of muscle, fascia and tendon of right hip, subsequent encounter: Secondary | ICD-10-CM | POA: Diagnosis not present

## 2019-06-01 DIAGNOSIS — M25551 Pain in right hip: Secondary | ICD-10-CM | POA: Diagnosis not present

## 2019-07-05 DIAGNOSIS — M25551 Pain in right hip: Secondary | ICD-10-CM | POA: Diagnosis not present

## 2019-07-19 DIAGNOSIS — M25511 Pain in right shoulder: Secondary | ICD-10-CM | POA: Diagnosis not present

## 2019-07-19 DIAGNOSIS — M0589 Other rheumatoid arthritis with rheumatoid factor of multiple sites: Secondary | ICD-10-CM | POA: Diagnosis not present

## 2019-07-19 DIAGNOSIS — H04129 Dry eye syndrome of unspecified lacrimal gland: Secondary | ICD-10-CM | POA: Diagnosis not present

## 2019-07-19 DIAGNOSIS — M2041 Other hammer toe(s) (acquired), right foot: Secondary | ICD-10-CM | POA: Diagnosis not present

## 2019-07-19 DIAGNOSIS — M810 Age-related osteoporosis without current pathological fracture: Secondary | ICD-10-CM | POA: Diagnosis not present

## 2019-07-19 DIAGNOSIS — M1711 Unilateral primary osteoarthritis, right knee: Secondary | ICD-10-CM | POA: Diagnosis not present

## 2019-07-19 DIAGNOSIS — M7581 Other shoulder lesions, right shoulder: Secondary | ICD-10-CM | POA: Diagnosis not present

## 2019-07-19 DIAGNOSIS — M25561 Pain in right knee: Secondary | ICD-10-CM | POA: Diagnosis not present

## 2019-07-19 DIAGNOSIS — M150 Primary generalized (osteo)arthritis: Secondary | ICD-10-CM | POA: Diagnosis not present

## 2019-07-19 DIAGNOSIS — Z79899 Other long term (current) drug therapy: Secondary | ICD-10-CM | POA: Diagnosis not present

## 2019-08-01 DIAGNOSIS — M810 Age-related osteoporosis without current pathological fracture: Secondary | ICD-10-CM | POA: Diagnosis not present

## 2019-08-14 ENCOUNTER — Ambulatory Visit: Payer: PPO | Admitting: Podiatry

## 2019-08-14 ENCOUNTER — Encounter: Payer: Self-pay | Admitting: Podiatry

## 2019-08-14 ENCOUNTER — Ambulatory Visit (INDEPENDENT_AMBULATORY_CARE_PROVIDER_SITE_OTHER): Payer: PPO

## 2019-08-14 ENCOUNTER — Other Ambulatory Visit: Payer: Self-pay

## 2019-08-14 VITALS — Temp 97.0°F

## 2019-08-14 DIAGNOSIS — M779 Enthesopathy, unspecified: Secondary | ICD-10-CM

## 2019-08-14 DIAGNOSIS — M2041 Other hammer toe(s) (acquired), right foot: Secondary | ICD-10-CM

## 2019-08-14 DIAGNOSIS — M2042 Other hammer toe(s) (acquired), left foot: Secondary | ICD-10-CM

## 2019-08-14 NOTE — Progress Notes (Signed)
Subjective:   Patient ID: Sheryl Foster, female   DOB: 74 y.o.   MRN: [id number]   HPI Patient presents stating the joints on my left foot have been inflamed and making it hard to walk.  Patient does have rheumatoid arthritis and is under treatment and states that the right one is moderately tender when pressed.  Patient does not smoke likes to be active and states it is been going on for several months and she likes to walk at least 2 miles a day   Review of Systems  All other systems reviewed and are negative.       Objective:  Physical Exam Vitals and nursing note reviewed.  Constitutional:      Appearance: She is well-developed.  Pulmonary:     Effort: Pulmonary effort is normal.  Musculoskeletal:        General: Normal range of motion.  Skin:    General: Skin is warm.  Neurological:     Mental Status: She is alert.     Neurovascular status was found to be intact muscle strength was found to be adequate with patient found to have quite a bit of forefoot discomfort and swelling left over right second and third metatarsal phalangeal joints.  Patient has elevated lesser digits bilateral that are moderately rigid contracted and has moderate flatfoot deformity bilateral     Assessment:  Inflammatory capsulitis lesser MPJs left over right with elevation of the lesser digits bilateral     Plan:  H&P conditions reviewed and at this point I explained acute inflammation versus the chronic problems associated with rheumatoid arthritis.  I did a block of the left forefoot 60 mg Xylocaine Marcaine mixture I then went ahead and aspirated the second and third MPJs getting out a small amount of clear fluid and injected quarter cc dexamethasone Kenalog applied thick plantar padding to reduce the pressure against the joints themselves.  Reappoint to recheck  X-rays indicate that there is significant rheumatoid arthritis with the joints narrowed and obvious signs of arthritis  occurring especially second and third left

## 2019-09-14 ENCOUNTER — Other Ambulatory Visit: Payer: Self-pay | Admitting: Internal Medicine

## 2019-09-14 DIAGNOSIS — Z1231 Encounter for screening mammogram for malignant neoplasm of breast: Secondary | ICD-10-CM

## 2019-10-25 DIAGNOSIS — M0589 Other rheumatoid arthritis with rheumatoid factor of multiple sites: Secondary | ICD-10-CM | POA: Diagnosis not present

## 2019-10-25 DIAGNOSIS — M25561 Pain in right knee: Secondary | ICD-10-CM | POA: Diagnosis not present

## 2019-10-25 DIAGNOSIS — M1711 Unilateral primary osteoarthritis, right knee: Secondary | ICD-10-CM | POA: Diagnosis not present

## 2019-10-25 DIAGNOSIS — H04129 Dry eye syndrome of unspecified lacrimal gland: Secondary | ICD-10-CM | POA: Diagnosis not present

## 2019-10-25 DIAGNOSIS — M150 Primary generalized (osteo)arthritis: Secondary | ICD-10-CM | POA: Diagnosis not present

## 2019-10-25 DIAGNOSIS — M810 Age-related osteoporosis without current pathological fracture: Secondary | ICD-10-CM | POA: Diagnosis not present

## 2019-10-25 DIAGNOSIS — M2041 Other hammer toe(s) (acquired), right foot: Secondary | ICD-10-CM | POA: Diagnosis not present

## 2019-10-25 DIAGNOSIS — Z79899 Other long term (current) drug therapy: Secondary | ICD-10-CM | POA: Diagnosis not present

## 2019-10-31 ENCOUNTER — Ambulatory Visit
Admission: RE | Admit: 2019-10-31 | Discharge: 2019-10-31 | Disposition: A | Discharge status: Home / Self Care | Payer: PPO | Source: Ambulatory Visit

## 2019-10-31 ENCOUNTER — Other Ambulatory Visit: Payer: Self-pay

## 2019-10-31 DIAGNOSIS — Z1231 Encounter for screening mammogram for malignant neoplasm of breast: Secondary | ICD-10-CM | POA: Diagnosis not present

## 2019-11-08 DIAGNOSIS — D2371 Other benign neoplasm of skin of right lower limb, including hip: Secondary | ICD-10-CM | POA: Diagnosis not present

## 2019-11-08 DIAGNOSIS — L821 Other seborrheic keratosis: Secondary | ICD-10-CM | POA: Diagnosis not present

## 2019-11-08 DIAGNOSIS — L738 Other specified follicular disorders: Secondary | ICD-10-CM | POA: Diagnosis not present

## 2019-11-08 DIAGNOSIS — L918 Other hypertrophic disorders of the skin: Secondary | ICD-10-CM | POA: Diagnosis not present

## 2019-11-08 DIAGNOSIS — D1801 Hemangioma of skin and subcutaneous tissue: Secondary | ICD-10-CM | POA: Diagnosis not present

## 2019-11-08 DIAGNOSIS — Z85828 Personal history of other malignant neoplasm of skin: Secondary | ICD-10-CM | POA: Diagnosis not present

## 2019-12-05 DIAGNOSIS — I208 Other forms of angina pectoris: Secondary | ICD-10-CM | POA: Diagnosis not present

## 2019-12-05 DIAGNOSIS — Z833 Family history of diabetes mellitus: Secondary | ICD-10-CM | POA: Diagnosis not present

## 2019-12-05 DIAGNOSIS — M8580 Other specified disorders of bone density and structure, unspecified site: Secondary | ICD-10-CM | POA: Diagnosis not present

## 2019-12-05 DIAGNOSIS — E039 Hypothyroidism, unspecified: Secondary | ICD-10-CM | POA: Diagnosis not present

## 2019-12-05 DIAGNOSIS — E559 Vitamin D deficiency, unspecified: Secondary | ICD-10-CM | POA: Diagnosis not present

## 2019-12-11 DIAGNOSIS — Z0000 Encounter for general adult medical examination without abnormal findings: Secondary | ICD-10-CM | POA: Diagnosis not present

## 2019-12-11 DIAGNOSIS — I7300 Raynaud's syndrome without gangrene: Secondary | ICD-10-CM | POA: Diagnosis not present

## 2019-12-11 DIAGNOSIS — S81812A Laceration without foreign body, left lower leg, initial encounter: Secondary | ICD-10-CM | POA: Diagnosis not present

## 2019-12-11 DIAGNOSIS — Z23 Encounter for immunization: Secondary | ICD-10-CM | POA: Diagnosis not present

## 2019-12-11 DIAGNOSIS — E875 Hyperkalemia: Secondary | ICD-10-CM | POA: Diagnosis not present

## 2019-12-11 DIAGNOSIS — M810 Age-related osteoporosis without current pathological fracture: Secondary | ICD-10-CM | POA: Diagnosis not present

## 2019-12-11 DIAGNOSIS — M3500 Sicca syndrome, unspecified: Secondary | ICD-10-CM | POA: Diagnosis not present

## 2019-12-11 DIAGNOSIS — M0579 Rheumatoid arthritis with rheumatoid factor of multiple sites without organ or systems involvement: Secondary | ICD-10-CM | POA: Diagnosis not present

## 2019-12-11 DIAGNOSIS — I208 Other forms of angina pectoris: Secondary | ICD-10-CM | POA: Diagnosis not present

## 2019-12-11 DIAGNOSIS — E039 Hypothyroidism, unspecified: Secondary | ICD-10-CM | POA: Diagnosis not present

## 2019-12-18 DIAGNOSIS — Z1211 Encounter for screening for malignant neoplasm of colon: Secondary | ICD-10-CM | POA: Diagnosis not present

## 2019-12-18 DIAGNOSIS — Z1212 Encounter for screening for malignant neoplasm of rectum: Secondary | ICD-10-CM | POA: Diagnosis not present

## 2019-12-26 LAB — COLOGUARD: COLOGUARD: NEGATIVE

## 2020-01-01 DIAGNOSIS — Z01419 Encounter for gynecological examination (general) (routine) without abnormal findings: Secondary | ICD-10-CM | POA: Diagnosis not present

## 2020-01-10 DIAGNOSIS — M810 Age-related osteoporosis without current pathological fracture: Secondary | ICD-10-CM | POA: Diagnosis not present

## 2020-01-26 DIAGNOSIS — Z79899 Other long term (current) drug therapy: Secondary | ICD-10-CM | POA: Diagnosis not present

## 2020-01-26 DIAGNOSIS — M1711 Unilateral primary osteoarthritis, right knee: Secondary | ICD-10-CM | POA: Diagnosis not present

## 2020-01-26 DIAGNOSIS — M810 Age-related osteoporosis without current pathological fracture: Secondary | ICD-10-CM | POA: Diagnosis not present

## 2020-01-26 DIAGNOSIS — M25561 Pain in right knee: Secondary | ICD-10-CM | POA: Diagnosis not present

## 2020-01-26 DIAGNOSIS — M0589 Other rheumatoid arthritis with rheumatoid factor of multiple sites: Secondary | ICD-10-CM | POA: Diagnosis not present

## 2020-01-26 DIAGNOSIS — M150 Primary generalized (osteo)arthritis: Secondary | ICD-10-CM | POA: Diagnosis not present

## 2020-01-26 DIAGNOSIS — H04129 Dry eye syndrome of unspecified lacrimal gland: Secondary | ICD-10-CM | POA: Diagnosis not present

## 2020-02-06 DIAGNOSIS — E039 Hypothyroidism, unspecified: Secondary | ICD-10-CM | POA: Diagnosis not present

## 2020-02-06 DIAGNOSIS — M810 Age-related osteoporosis without current pathological fracture: Secondary | ICD-10-CM | POA: Diagnosis not present

## 2020-02-27 ENCOUNTER — Other Ambulatory Visit: Payer: Self-pay

## 2020-02-27 ENCOUNTER — Ambulatory Visit (HOSPITAL_COMMUNITY)
Admission: EM | Admit: 2020-02-27 | Discharge: 2020-02-27 | Disposition: A | Discharge status: Home / Self Care | Payer: PPO | Attending: Family Medicine | Admitting: Family Medicine

## 2020-02-27 ENCOUNTER — Ambulatory Visit (INDEPENDENT_AMBULATORY_CARE_PROVIDER_SITE_OTHER): Payer: PPO

## 2020-02-27 DIAGNOSIS — R059 Cough, unspecified: Secondary | ICD-10-CM

## 2020-02-27 MED ORDER — AZITHROMYCIN 250 MG PO TABS
250.0000 mg | ORAL_TABLET | Freq: Every day | ORAL | 0 refills | Status: DC
Start: 2020-02-27 — End: 2021-10-22

## 2020-02-27 NOTE — Discharge Instructions (Signed)
Please try honey  Please try lozenges  Please try the medicine if the symptoms fail to improve  Please follow up if your symptom fail to improve.

## 2020-02-27 NOTE — ED Triage Notes (Signed)
Pt reports a dry ch for a few weeks. Pt denies Sore throat,HA or fever. Pt  wants an X-ray to rule out PNA.

## 2020-02-27 NOTE — ED Provider Notes (Signed)
[facility]    CSN: [id number] Arrival date & time: 02/27/20  1607      History   Chief Complaint Chief Complaint  Patient presents with  . Cough    HPI Sheryl Foster is a 74 y.o. female.   She is presenting with a 3-week history of cough.  Seems to be getting worse.  No fevers or chills.  Denies any exposure anyone similar symptoms.  Has not tried anything for the cough.  HPI  Past Medical History:  Diagnosis Date  . Cancer ([place])    skin cancer  basal cell  . Heart murmur    grade 1  not sympatic  . Hypothyroidism   . Pneumonia    history  . Rheumatoid arthritis ([place])   . Sjogren's disease [facility])     Patient Active Problem List   Diagnosis Date Noted  . Chest pain 01/07/2018  . Primary osteoarthritis of right hip 01/07/2016  . Right femoral fracture ([place]) 03/16/2015  . Hypothyroid 03/16/2015  . Femoral neck fracture, right, closed, initial encounter 03/16/2015  . Femur fracture ([place]) 03/16/2015  . Rheumatoid arthritis ([place]) 03/16/2015  . Sjogren's disease ([place]) 03/16/2015  . Closed fracture of neck of right femur ([place])   . Pre-op chest exam     Past Surgical History:  Procedure Laterality Date  . DILATION AND CURETTAGE OF UTERUS    . HIP PINNING,CANNULATED Right 03/17/2015   Procedure: FEMORAL NECK PINNING;  Surgeon: Renette Butters, MD;  Location: [facility];  Service: Orthopedics;  Laterality: Right;  . TONSILLECTOMY    . TOTAL HIP ARTHROPLASTY Right 01/07/2016   Procedure: RIGHT TOTAL HIP ARTHROPLASTY ANTERIOR APPROACH;  Surgeon: Renette Butters, MD;  Location: [facility];  Service: Orthopedics;  Laterality: Right;    OB History   No obstetric history on file.      Home Medications    Prior to Admission medications   Medication Sig Start Date End Date Taking? Authorizing Provider  CALCIUM-VITAMIN D PO Take 1 capsule by mouth daily.   Yes [provider]  Cholecalciferol (VITAMIN D PO) Take 2,000 Units by mouth daily.     Yes [provider]  Cyanocobalamin (VITAMIN B 12 PO) Take 1 tablet by mouth daily.   Yes [provider]  folic acid (FOLVITE) 1 MG tablet Take 1 mg by mouth daily.  10/18/09  Yes [provider]  Glucosamine HCl (GLUCOSAMINE PO) Take 1 capsule by mouth daily.   Yes [provider]  levothyroxine (SYNTHROID, LEVOTHROID) 88 MCG tablet Take 1 tablet by mouth daily. 11/15/09  Yes [provider]  meloxicam (MOBIC) 15 MG tablet Take 7.5 mg by mouth daily as needed.  10/23/09  Yes [provider]  methotrexate (RHEUMATREX) 2.5 MG tablet Take 3 tablets by mouth once a week. Stopped prior to surgery 10/28/09  Yes [provider]  Omega-3 Fatty Acids (FISH OIL PO) Take 1 capsule by mouth 2 (two) times daily.   Yes [provider]  S-Adenosylmethionine (SAME) 400 MG TABS Take 400 mg by mouth daily.   Yes [provider]  azithromycin (ZITHROMAX) 250 MG tablet Take 1 tablet (250 mg total) by mouth daily. Take first 2 tablets together, then 1 every day until finished. 02/27/20   Rosemarie Ax, MD  Loteprednol Etabonate (LOTEMAX) 0.5 % GEL Apply to eye. 12/15/18   [provider]  methylPREDNISolone (MEDROL DOSEPAK) 4 MG TBPK tablet See admin instructions. follow package directions 05/31/19  [provider]  moxifloxacin (VIGAMOX) 0.5 % ophthalmic solution Apply to eye. 12/15/18   [provider]  OVER THE COUNTER MEDICATION Take 2 capsules by mouth 2 (two) times daily. Bio Tears  Vision supplement    [provider]    Family History Family History  Problem Relation Age of Onset  . Diabetes Mother   . Diabetes Brother   . Breast cancer Neg Hx     Social History Social History   Tobacco Use  . Smoking status: Never Smoker  . Smokeless tobacco: Never Used  Substance Use Topics  . Alcohol use: Yes    Alcohol/week: 7.0 standard drinks    Types: 7 Glasses of wine per week  . Drug  use: No     Allergies   Tobramycin and Sulfasalazine   Review of Systems Review of Systems  See HPI  Physical Exam Triage Vital Signs ED Triage Vitals  Enc Vitals Group     BP 02/27/20 1721 (!) 157/88     Pulse Rate 02/27/20 1721 70     Resp 02/27/20 1721 18     Temp 02/27/20 1721 99.2 F (37.3 C)     Temp Source 02/27/20 1721 Oral     SpO2 02/27/20 1721 96 %     Weight 02/27/20 1722 160 lb (72.6 kg)     Height 02/27/20 1722 5\' 5"  (1.651 m)     Head Circumference --      Peak Flow --      Pain Score 02/27/20 1722 0     Pain Loc --      Pain Edu? --      Excl. in [place]? --    No data found.  Updated Vital Signs BP (!) 157/88 (BP Location: Right Arm)   Pulse 70   Temp 99.2 F (37.3 C) (Oral)   Resp 18   Ht 5\' 5"  (1.651 m)   Wt 72.6 kg   SpO2 96%   BMI 26.63 kg/m   Visual Acuity Right Eye Distance:   Left Eye Distance:   Bilateral Distance:    Right Eye Near:   Left Eye Near:    Bilateral Near:     Physical Exam Gen: NAD, alert, cooperative with exam, well-appearing ENT: normal lips, normal nasal mucosa,  Eye: normal EOM, normal conjunctiva and lids CV: Regular rate and rhythm Resp: no accessory muscle use, non-labored, clear to auscultation Skin: no rashes, no areas of induration  Neuro: normal tone, normal sensation to touch Psych:  normal insight, alert and oriented     UC Treatments / Results  Labs (all labs ordered are listed, but only abnormal results are displayed) Labs Reviewed - No data to display  EKG   Radiology DG Chest 2 View  Result Date: 02/27/2020 CLINICAL DATA:  Nonproductive cough for several weeks. EXAM: CHEST - 2 VIEW COMPARISON:  12/27/2015 FINDINGS: The heart size and mediastinal contours are within normal limits. Aortic atherosclerotic calcification noted. Both lungs are clear. The visualized skeletal structures are unremarkable. IMPRESSION: No active cardiopulmonary disease. Electronically Signed   By: Marlaine Hind M.D.    On: 02/27/2020 18:25    Procedures Procedures (including critical care time)  Medications Ordered in UC Medications - No data to display  Initial Impression / Assessment and Plan / UC Course  I have reviewed the triage vital signs and the nursing notes.  Pertinent labs & imaging results that were available during my care of the patient were reviewed by  me and considered in my medical decision making (see chart for details).     Sheryl Foster is a 74 year old female is presenting with cough.  Imaging was negative for fluid or pneumonia.  Provide azithromycin to take if her symptoms fail to improve.  Counseled supportive care.  Given indications on follow-up.  Final Clinical Impressions(s) / UC Diagnoses   Final diagnoses:  Cough     Discharge Instructions     Please try honey  Please try lozenges  Please try the medicine if the symptoms fail to improve  Please follow up if your symptom fail to improve.     ED Prescriptions    Medication Sig Dispense Auth. Provider   azithromycin (ZITHROMAX) 250 MG tablet Take 1 tablet (250 mg total) by mouth daily. Take first 2 tablets together, then 1 every day until finished. 6 tablet Rosemarie Ax, MD     PDMP not reviewed this encounter.   Rosemarie Ax, MD 02/27/20 [phone]

## 2020-03-25 DIAGNOSIS — H1033 Unspecified acute conjunctivitis, bilateral: Secondary | ICD-10-CM | POA: Diagnosis not present

## 2020-04-10 DIAGNOSIS — H04123 Dry eye syndrome of bilateral lacrimal glands: Secondary | ICD-10-CM | POA: Diagnosis not present

## 2020-04-29 DIAGNOSIS — M1711 Unilateral primary osteoarthritis, right knee: Secondary | ICD-10-CM | POA: Diagnosis not present

## 2020-04-29 DIAGNOSIS — M810 Age-related osteoporosis without current pathological fracture: Secondary | ICD-10-CM | POA: Diagnosis not present

## 2020-04-29 DIAGNOSIS — M0589 Other rheumatoid arthritis with rheumatoid factor of multiple sites: Secondary | ICD-10-CM | POA: Diagnosis not present

## 2020-04-29 DIAGNOSIS — M25561 Pain in right knee: Secondary | ICD-10-CM | POA: Diagnosis not present

## 2020-04-29 DIAGNOSIS — H04129 Dry eye syndrome of unspecified lacrimal gland: Secondary | ICD-10-CM | POA: Diagnosis not present

## 2020-04-29 DIAGNOSIS — M150 Primary generalized (osteo)arthritis: Secondary | ICD-10-CM | POA: Diagnosis not present

## 2020-04-29 DIAGNOSIS — Z79899 Other long term (current) drug therapy: Secondary | ICD-10-CM | POA: Diagnosis not present

## 2020-05-07 DIAGNOSIS — Z79899 Other long term (current) drug therapy: Secondary | ICD-10-CM | POA: Diagnosis not present

## 2020-05-07 DIAGNOSIS — M0589 Other rheumatoid arthritis with rheumatoid factor of multiple sites: Secondary | ICD-10-CM | POA: Diagnosis not present

## 2020-08-04 DIAGNOSIS — M0589 Other rheumatoid arthritis with rheumatoid factor of multiple sites: Secondary | ICD-10-CM | POA: Diagnosis not present

## 2020-08-05 DIAGNOSIS — Z79899 Other long term (current) drug therapy: Secondary | ICD-10-CM | POA: Diagnosis not present

## 2020-08-05 DIAGNOSIS — M1711 Unilateral primary osteoarthritis, right knee: Secondary | ICD-10-CM | POA: Diagnosis not present

## 2020-08-05 DIAGNOSIS — H04129 Dry eye syndrome of unspecified lacrimal gland: Secondary | ICD-10-CM | POA: Diagnosis not present

## 2020-08-05 DIAGNOSIS — M810 Age-related osteoporosis without current pathological fracture: Secondary | ICD-10-CM | POA: Diagnosis not present

## 2020-08-05 DIAGNOSIS — M0589 Other rheumatoid arthritis with rheumatoid factor of multiple sites: Secondary | ICD-10-CM | POA: Diagnosis not present

## 2020-08-05 DIAGNOSIS — M150 Primary generalized (osteo)arthritis: Secondary | ICD-10-CM | POA: Diagnosis not present

## 2020-08-05 DIAGNOSIS — M25561 Pain in right knee: Secondary | ICD-10-CM | POA: Diagnosis not present

## 2020-08-07 DIAGNOSIS — M810 Age-related osteoporosis without current pathological fracture: Secondary | ICD-10-CM | POA: Diagnosis not present

## 2020-11-05 DIAGNOSIS — H04129 Dry eye syndrome of unspecified lacrimal gland: Secondary | ICD-10-CM | POA: Diagnosis not present

## 2020-11-05 DIAGNOSIS — M150 Primary generalized (osteo)arthritis: Secondary | ICD-10-CM | POA: Diagnosis not present

## 2020-11-05 DIAGNOSIS — M549 Dorsalgia, unspecified: Secondary | ICD-10-CM | POA: Diagnosis not present

## 2020-11-05 DIAGNOSIS — M810 Age-related osteoporosis without current pathological fracture: Secondary | ICD-10-CM | POA: Diagnosis not present

## 2020-11-05 DIAGNOSIS — Z79899 Other long term (current) drug therapy: Secondary | ICD-10-CM | POA: Diagnosis not present

## 2020-11-05 DIAGNOSIS — M25561 Pain in right knee: Secondary | ICD-10-CM | POA: Diagnosis not present

## 2020-11-05 DIAGNOSIS — M1711 Unilateral primary osteoarthritis, right knee: Secondary | ICD-10-CM | POA: Diagnosis not present

## 2020-11-05 DIAGNOSIS — M0589 Other rheumatoid arthritis with rheumatoid factor of multiple sites: Secondary | ICD-10-CM | POA: Diagnosis not present

## 2020-11-07 DIAGNOSIS — Z85828 Personal history of other malignant neoplasm of skin: Secondary | ICD-10-CM | POA: Diagnosis not present

## 2020-11-07 DIAGNOSIS — D2371 Other benign neoplasm of skin of right lower limb, including hip: Secondary | ICD-10-CM | POA: Diagnosis not present

## 2020-11-07 DIAGNOSIS — L570 Actinic keratosis: Secondary | ICD-10-CM | POA: Diagnosis not present

## 2020-11-07 DIAGNOSIS — L821 Other seborrheic keratosis: Secondary | ICD-10-CM | POA: Diagnosis not present

## 2020-11-07 DIAGNOSIS — L738 Other specified follicular disorders: Secondary | ICD-10-CM | POA: Diagnosis not present

## 2020-11-07 DIAGNOSIS — D692 Other nonthrombocytopenic purpura: Secondary | ICD-10-CM | POA: Diagnosis not present

## 2020-11-08 ENCOUNTER — Other Ambulatory Visit: Payer: Self-pay | Admitting: Internal Medicine

## 2020-11-08 DIAGNOSIS — Z1231 Encounter for screening mammogram for malignant neoplasm of breast: Secondary | ICD-10-CM

## 2020-11-15 ENCOUNTER — Ambulatory Visit
Admission: RE | Admit: 2020-11-15 | Discharge: 2020-11-15 | Disposition: A | Discharge status: Home / Self Care | Payer: PPO | Source: Ambulatory Visit | Attending: Internal Medicine | Admitting: Internal Medicine

## 2020-11-15 ENCOUNTER — Other Ambulatory Visit: Payer: Self-pay

## 2020-11-15 DIAGNOSIS — Z1231 Encounter for screening mammogram for malignant neoplasm of breast: Secondary | ICD-10-CM | POA: Diagnosis not present

## 2020-12-12 DIAGNOSIS — M810 Age-related osteoporosis without current pathological fracture: Secondary | ICD-10-CM | POA: Diagnosis not present

## 2020-12-12 DIAGNOSIS — E039 Hypothyroidism, unspecified: Secondary | ICD-10-CM | POA: Diagnosis not present

## 2020-12-13 DIAGNOSIS — M25551 Pain in right hip: Secondary | ICD-10-CM | POA: Diagnosis not present

## 2020-12-13 DIAGNOSIS — M545 Low back pain, unspecified: Secondary | ICD-10-CM | POA: Diagnosis not present

## 2020-12-16 DIAGNOSIS — E039 Hypothyroidism, unspecified: Secondary | ICD-10-CM | POA: Diagnosis not present

## 2020-12-16 DIAGNOSIS — Z0000 Encounter for general adult medical examination without abnormal findings: Secondary | ICD-10-CM | POA: Diagnosis not present

## 2020-12-16 DIAGNOSIS — M810 Age-related osteoporosis without current pathological fracture: Secondary | ICD-10-CM | POA: Diagnosis not present

## 2020-12-16 DIAGNOSIS — M3500 Sicca syndrome, unspecified: Secondary | ICD-10-CM | POA: Diagnosis not present

## 2020-12-16 DIAGNOSIS — M0579 Rheumatoid arthritis with rheumatoid factor of multiple sites without organ or systems involvement: Secondary | ICD-10-CM | POA: Diagnosis not present

## 2020-12-16 DIAGNOSIS — Z23 Encounter for immunization: Secondary | ICD-10-CM | POA: Diagnosis not present

## 2020-12-19 DIAGNOSIS — M25551 Pain in right hip: Secondary | ICD-10-CM | POA: Diagnosis not present

## 2021-01-01 DIAGNOSIS — Z01419 Encounter for gynecological examination (general) (routine) without abnormal findings: Secondary | ICD-10-CM | POA: Diagnosis not present

## 2021-01-08 DIAGNOSIS — Z85828 Personal history of other malignant neoplasm of skin: Secondary | ICD-10-CM | POA: Diagnosis not present

## 2021-01-08 DIAGNOSIS — L649 Androgenic alopecia, unspecified: Secondary | ICD-10-CM | POA: Diagnosis not present

## 2021-01-08 DIAGNOSIS — M25551 Pain in right hip: Secondary | ICD-10-CM | POA: Diagnosis not present

## 2021-01-28 DIAGNOSIS — H52223 Regular astigmatism, bilateral: Secondary | ICD-10-CM | POA: Diagnosis not present

## 2021-01-28 DIAGNOSIS — H2513 Age-related nuclear cataract, bilateral: Secondary | ICD-10-CM | POA: Diagnosis not present

## 2021-01-28 DIAGNOSIS — H5213 Myopia, bilateral: Secondary | ICD-10-CM | POA: Diagnosis not present

## 2021-01-28 DIAGNOSIS — H524 Presbyopia: Secondary | ICD-10-CM | POA: Diagnosis not present

## 2021-01-28 DIAGNOSIS — M3501 Sicca syndrome with keratoconjunctivitis: Secondary | ICD-10-CM | POA: Diagnosis not present

## 2021-02-05 DIAGNOSIS — M1711 Unilateral primary osteoarthritis, right knee: Secondary | ICD-10-CM | POA: Diagnosis not present

## 2021-02-05 DIAGNOSIS — Z79899 Other long term (current) drug therapy: Secondary | ICD-10-CM | POA: Diagnosis not present

## 2021-02-05 DIAGNOSIS — M810 Age-related osteoporosis without current pathological fracture: Secondary | ICD-10-CM | POA: Diagnosis not present

## 2021-02-05 DIAGNOSIS — M549 Dorsalgia, unspecified: Secondary | ICD-10-CM | POA: Diagnosis not present

## 2021-02-05 DIAGNOSIS — M0589 Other rheumatoid arthritis with rheumatoid factor of multiple sites: Secondary | ICD-10-CM | POA: Diagnosis not present

## 2021-02-05 DIAGNOSIS — H04129 Dry eye syndrome of unspecified lacrimal gland: Secondary | ICD-10-CM | POA: Diagnosis not present

## 2021-02-05 DIAGNOSIS — M25561 Pain in right knee: Secondary | ICD-10-CM | POA: Diagnosis not present

## 2021-02-05 DIAGNOSIS — M150 Primary generalized (osteo)arthritis: Secondary | ICD-10-CM | POA: Diagnosis not present

## 2021-02-11 DIAGNOSIS — M810 Age-related osteoporosis without current pathological fracture: Secondary | ICD-10-CM | POA: Diagnosis not present

## 2021-02-26 DIAGNOSIS — M0579 Rheumatoid arthritis with rheumatoid factor of multiple sites without organ or systems involvement: Secondary | ICD-10-CM | POA: Diagnosis not present

## 2021-02-26 DIAGNOSIS — E039 Hypothyroidism, unspecified: Secondary | ICD-10-CM | POA: Diagnosis not present

## 2021-02-26 DIAGNOSIS — M8580 Other specified disorders of bone density and structure, unspecified site: Secondary | ICD-10-CM | POA: Diagnosis not present

## 2021-02-26 DIAGNOSIS — K5730 Diverticulosis of large intestine without perforation or abscess without bleeding: Secondary | ICD-10-CM | POA: Diagnosis not present

## 2021-04-16 DIAGNOSIS — H04123 Dry eye syndrome of bilateral lacrimal glands: Secondary | ICD-10-CM | POA: Diagnosis not present

## 2021-04-24 DIAGNOSIS — R030 Elevated blood-pressure reading, without diagnosis of hypertension: Secondary | ICD-10-CM | POA: Diagnosis not present

## 2021-04-25 ENCOUNTER — Other Ambulatory Visit: Payer: Self-pay | Admitting: Internal Medicine

## 2021-04-25 DIAGNOSIS — R030 Elevated blood-pressure reading, without diagnosis of hypertension: Secondary | ICD-10-CM | POA: Diagnosis not present

## 2021-04-29 DIAGNOSIS — M0579 Rheumatoid arthritis with rheumatoid factor of multiple sites without organ or systems involvement: Secondary | ICD-10-CM | POA: Diagnosis not present

## 2021-04-29 DIAGNOSIS — K5730 Diverticulosis of large intestine without perforation or abscess without bleeding: Secondary | ICD-10-CM | POA: Diagnosis not present

## 2021-04-29 DIAGNOSIS — M8580 Other specified disorders of bone density and structure, unspecified site: Secondary | ICD-10-CM | POA: Diagnosis not present

## 2021-04-29 DIAGNOSIS — E039 Hypothyroidism, unspecified: Secondary | ICD-10-CM | POA: Diagnosis not present

## 2021-05-08 DIAGNOSIS — M810 Age-related osteoporosis without current pathological fracture: Secondary | ICD-10-CM | POA: Diagnosis not present

## 2021-05-08 DIAGNOSIS — M549 Dorsalgia, unspecified: Secondary | ICD-10-CM | POA: Diagnosis not present

## 2021-05-08 DIAGNOSIS — M25561 Pain in right knee: Secondary | ICD-10-CM | POA: Diagnosis not present

## 2021-05-08 DIAGNOSIS — Z79899 Other long term (current) drug therapy: Secondary | ICD-10-CM | POA: Diagnosis not present

## 2021-05-08 DIAGNOSIS — H04129 Dry eye syndrome of unspecified lacrimal gland: Secondary | ICD-10-CM | POA: Diagnosis not present

## 2021-05-08 DIAGNOSIS — M1711 Unilateral primary osteoarthritis, right knee: Secondary | ICD-10-CM | POA: Diagnosis not present

## 2021-05-08 DIAGNOSIS — M0589 Other rheumatoid arthritis with rheumatoid factor of multiple sites: Secondary | ICD-10-CM | POA: Diagnosis not present

## 2021-05-08 DIAGNOSIS — M150 Primary generalized (osteo)arthritis: Secondary | ICD-10-CM | POA: Diagnosis not present

## 2021-06-02 ENCOUNTER — Ambulatory Visit
Admission: RE | Admit: 2021-06-02 | Discharge: 2021-06-02 | Disposition: A | Discharge status: Home / Self Care | Payer: No Typology Code available for payment source | Source: Ambulatory Visit | Attending: Internal Medicine | Admitting: Internal Medicine

## 2021-06-02 DIAGNOSIS — R030 Elevated blood-pressure reading, without diagnosis of hypertension: Secondary | ICD-10-CM

## 2021-06-02 DIAGNOSIS — Z8249 Family history of ischemic heart disease and other diseases of the circulatory system: Secondary | ICD-10-CM | POA: Diagnosis not present

## 2021-06-17 DIAGNOSIS — I2510 Atherosclerotic heart disease of native coronary artery without angina pectoris: Secondary | ICD-10-CM | POA: Diagnosis not present

## 2021-08-05 DIAGNOSIS — M549 Dorsalgia, unspecified: Secondary | ICD-10-CM | POA: Diagnosis not present

## 2021-08-05 DIAGNOSIS — M810 Age-related osteoporosis without current pathological fracture: Secondary | ICD-10-CM | POA: Diagnosis not present

## 2021-08-05 DIAGNOSIS — M150 Primary generalized (osteo)arthritis: Secondary | ICD-10-CM | POA: Diagnosis not present

## 2021-08-05 DIAGNOSIS — M25561 Pain in right knee: Secondary | ICD-10-CM | POA: Diagnosis not present

## 2021-08-05 DIAGNOSIS — H04129 Dry eye syndrome of unspecified lacrimal gland: Secondary | ICD-10-CM | POA: Diagnosis not present

## 2021-08-05 DIAGNOSIS — M0589 Other rheumatoid arthritis with rheumatoid factor of multiple sites: Secondary | ICD-10-CM | POA: Diagnosis not present

## 2021-08-05 DIAGNOSIS — Z79899 Other long term (current) drug therapy: Secondary | ICD-10-CM | POA: Diagnosis not present

## 2021-08-05 DIAGNOSIS — M1711 Unilateral primary osteoarthritis, right knee: Secondary | ICD-10-CM | POA: Diagnosis not present

## 2021-08-12 DIAGNOSIS — I2510 Atherosclerotic heart disease of native coronary artery without angina pectoris: Secondary | ICD-10-CM | POA: Diagnosis not present

## 2021-08-12 DIAGNOSIS — M810 Age-related osteoporosis without current pathological fracture: Secondary | ICD-10-CM | POA: Diagnosis not present

## 2021-10-06 ENCOUNTER — Other Ambulatory Visit: Payer: Self-pay | Admitting: Internal Medicine

## 2021-10-06 DIAGNOSIS — Z1231 Encounter for screening mammogram for malignant neoplasm of breast: Secondary | ICD-10-CM

## 2021-10-22 ENCOUNTER — Ambulatory Visit (INDEPENDENT_AMBULATORY_CARE_PROVIDER_SITE_OTHER): Payer: PPO

## 2021-10-22 ENCOUNTER — Ambulatory Visit: Payer: PPO | Admitting: Podiatry

## 2021-10-22 DIAGNOSIS — M2041 Other hammer toe(s) (acquired), right foot: Secondary | ICD-10-CM | POA: Diagnosis not present

## 2021-10-22 DIAGNOSIS — M2042 Other hammer toe(s) (acquired), left foot: Secondary | ICD-10-CM | POA: Diagnosis not present

## 2021-10-22 DIAGNOSIS — Q828 Other specified congenital malformations of skin: Secondary | ICD-10-CM | POA: Diagnosis not present

## 2021-10-22 NOTE — Progress Notes (Signed)
Subjective:   Patient ID: Sheryl Foster, female   DOB: 76 y.o.   MRN: [id number]   HPI Patient concerns with pain of both lesser digits on both feet and states the right third digit has become quite tender at the end of the toe making it hard to walk.  She has utilized previous Pap therapy   ROS      Objective:  Physical Exam  Neurovascular status intact with patient found to have digital deformities bilateral with moderate hammertoe deformities that she has questions about and distal keratotic lesion digit 3 right painful when pressed     Assessment:  Structural deformity of both feet with also porokeratotic lesion third digit right foot distal     Plan:  H&P reviewed conditions and recommended for the third digit sterile debridement which was accomplished today no iatrogenic bleeding and then discussed hammertoes and have recommended the consideration for digital shortening but do not recommend at the current time if symptoms get worse something we could consider

## 2021-11-06 DIAGNOSIS — H04129 Dry eye syndrome of unspecified lacrimal gland: Secondary | ICD-10-CM | POA: Diagnosis not present

## 2021-11-06 DIAGNOSIS — M150 Primary generalized (osteo)arthritis: Secondary | ICD-10-CM | POA: Diagnosis not present

## 2021-11-06 DIAGNOSIS — M25561 Pain in right knee: Secondary | ICD-10-CM | POA: Diagnosis not present

## 2021-11-06 DIAGNOSIS — M810 Age-related osteoporosis without current pathological fracture: Secondary | ICD-10-CM | POA: Diagnosis not present

## 2021-11-06 DIAGNOSIS — M1711 Unilateral primary osteoarthritis, right knee: Secondary | ICD-10-CM | POA: Diagnosis not present

## 2021-11-06 DIAGNOSIS — M0589 Other rheumatoid arthritis with rheumatoid factor of multiple sites: Secondary | ICD-10-CM | POA: Diagnosis not present

## 2021-11-06 DIAGNOSIS — Z79899 Other long term (current) drug therapy: Secondary | ICD-10-CM | POA: Diagnosis not present

## 2021-11-06 DIAGNOSIS — M549 Dorsalgia, unspecified: Secondary | ICD-10-CM | POA: Diagnosis not present

## 2021-11-13 DIAGNOSIS — L821 Other seborrheic keratosis: Secondary | ICD-10-CM | POA: Diagnosis not present

## 2021-11-13 DIAGNOSIS — D1801 Hemangioma of skin and subcutaneous tissue: Secondary | ICD-10-CM | POA: Diagnosis not present

## 2021-11-13 DIAGNOSIS — Z85828 Personal history of other malignant neoplasm of skin: Secondary | ICD-10-CM | POA: Diagnosis not present

## 2021-11-13 DIAGNOSIS — L649 Androgenic alopecia, unspecified: Secondary | ICD-10-CM | POA: Diagnosis not present

## 2021-11-13 DIAGNOSIS — L720 Epidermal cyst: Secondary | ICD-10-CM | POA: Diagnosis not present

## 2021-11-17 ENCOUNTER — Ambulatory Visit
Admission: RE | Admit: 2021-11-17 | Discharge: 2021-11-17 | Disposition: A | Discharge status: Home / Self Care | Payer: PPO | Source: Ambulatory Visit | Attending: Internal Medicine | Admitting: Internal Medicine

## 2021-11-17 DIAGNOSIS — Z1231 Encounter for screening mammogram for malignant neoplasm of breast: Secondary | ICD-10-CM

## 2021-11-20 DIAGNOSIS — H16143 Punctate keratitis, bilateral: Secondary | ICD-10-CM | POA: Diagnosis not present

## 2021-12-18 DIAGNOSIS — E039 Hypothyroidism, unspecified: Secondary | ICD-10-CM | POA: Diagnosis not present

## 2021-12-18 DIAGNOSIS — M810 Age-related osteoporosis without current pathological fracture: Secondary | ICD-10-CM | POA: Diagnosis not present

## 2021-12-22 DIAGNOSIS — I700 Atherosclerosis of aorta: Secondary | ICD-10-CM | POA: Diagnosis not present

## 2021-12-22 DIAGNOSIS — E039 Hypothyroidism, unspecified: Secondary | ICD-10-CM | POA: Diagnosis not present

## 2021-12-22 DIAGNOSIS — M810 Age-related osteoporosis without current pathological fracture: Secondary | ICD-10-CM | POA: Diagnosis not present

## 2021-12-22 DIAGNOSIS — M3500 Sicca syndrome, unspecified: Secondary | ICD-10-CM | POA: Diagnosis not present

## 2021-12-22 DIAGNOSIS — M0579 Rheumatoid arthritis with rheumatoid factor of multiple sites without organ or systems involvement: Secondary | ICD-10-CM | POA: Diagnosis not present

## 2021-12-22 DIAGNOSIS — Z23 Encounter for immunization: Secondary | ICD-10-CM | POA: Diagnosis not present

## 2021-12-22 DIAGNOSIS — Z0000 Encounter for general adult medical examination without abnormal findings: Secondary | ICD-10-CM | POA: Diagnosis not present

## 2021-12-22 DIAGNOSIS — I2510 Atherosclerotic heart disease of native coronary artery without angina pectoris: Secondary | ICD-10-CM | POA: Diagnosis not present

## 2021-12-31 DIAGNOSIS — M25551 Pain in right hip: Secondary | ICD-10-CM | POA: Diagnosis not present

## 2022-01-05 DIAGNOSIS — Z23 Encounter for immunization: Secondary | ICD-10-CM | POA: Diagnosis not present

## 2022-01-05 DIAGNOSIS — M810 Age-related osteoporosis without current pathological fracture: Secondary | ICD-10-CM | POA: Diagnosis not present

## 2022-01-05 DIAGNOSIS — Z01419 Encounter for gynecological examination (general) (routine) without abnormal findings: Secondary | ICD-10-CM | POA: Diagnosis not present

## 2022-01-05 DIAGNOSIS — Z1212 Encounter for screening for malignant neoplasm of rectum: Secondary | ICD-10-CM | POA: Diagnosis not present

## 2022-02-02 DIAGNOSIS — E039 Hypothyroidism, unspecified: Secondary | ICD-10-CM | POA: Diagnosis not present

## 2022-02-05 DIAGNOSIS — M1711 Unilateral primary osteoarthritis, right knee: Secondary | ICD-10-CM | POA: Diagnosis not present

## 2022-02-05 DIAGNOSIS — Z79899 Other long term (current) drug therapy: Secondary | ICD-10-CM | POA: Diagnosis not present

## 2022-02-05 DIAGNOSIS — M25561 Pain in right knee: Secondary | ICD-10-CM | POA: Diagnosis not present

## 2022-02-05 DIAGNOSIS — M549 Dorsalgia, unspecified: Secondary | ICD-10-CM | POA: Diagnosis not present

## 2022-02-05 DIAGNOSIS — M150 Primary generalized (osteo)arthritis: Secondary | ICD-10-CM | POA: Diagnosis not present

## 2022-02-05 DIAGNOSIS — M0589 Other rheumatoid arthritis with rheumatoid factor of multiple sites: Secondary | ICD-10-CM | POA: Diagnosis not present

## 2022-02-05 DIAGNOSIS — H04129 Dry eye syndrome of unspecified lacrimal gland: Secondary | ICD-10-CM | POA: Diagnosis not present

## 2022-02-05 DIAGNOSIS — M810 Age-related osteoporosis without current pathological fracture: Secondary | ICD-10-CM | POA: Diagnosis not present

## 2022-02-13 DIAGNOSIS — M810 Age-related osteoporosis without current pathological fracture: Secondary | ICD-10-CM | POA: Diagnosis not present

## 2022-02-25 DIAGNOSIS — M810 Age-related osteoporosis without current pathological fracture: Secondary | ICD-10-CM | POA: Diagnosis not present

## 2022-04-17 DIAGNOSIS — H524 Presbyopia: Secondary | ICD-10-CM | POA: Diagnosis not present

## 2022-04-17 DIAGNOSIS — H5213 Myopia, bilateral: Secondary | ICD-10-CM | POA: Diagnosis not present

## 2022-04-17 DIAGNOSIS — H52223 Regular astigmatism, bilateral: Secondary | ICD-10-CM | POA: Diagnosis not present

## 2022-04-17 DIAGNOSIS — H04123 Dry eye syndrome of bilateral lacrimal glands: Secondary | ICD-10-CM | POA: Diagnosis not present

## 2022-05-11 DIAGNOSIS — M1711 Unilateral primary osteoarthritis, right knee: Secondary | ICD-10-CM | POA: Diagnosis not present

## 2022-05-11 DIAGNOSIS — Z79899 Other long term (current) drug therapy: Secondary | ICD-10-CM | POA: Diagnosis not present

## 2022-05-11 DIAGNOSIS — M0589 Other rheumatoid arthritis with rheumatoid factor of multiple sites: Secondary | ICD-10-CM | POA: Diagnosis not present

## 2022-05-11 DIAGNOSIS — M549 Dorsalgia, unspecified: Secondary | ICD-10-CM | POA: Diagnosis not present

## 2022-05-11 DIAGNOSIS — M810 Age-related osteoporosis without current pathological fracture: Secondary | ICD-10-CM | POA: Diagnosis not present

## 2022-05-11 DIAGNOSIS — M150 Primary generalized (osteo)arthritis: Secondary | ICD-10-CM | POA: Diagnosis not present

## 2022-05-11 DIAGNOSIS — M25561 Pain in right knee: Secondary | ICD-10-CM | POA: Diagnosis not present

## 2022-05-11 DIAGNOSIS — H04129 Dry eye syndrome of unspecified lacrimal gland: Secondary | ICD-10-CM | POA: Diagnosis not present

## 2022-06-17 DIAGNOSIS — H25043 Posterior subcapsular polar age-related cataract, bilateral: Secondary | ICD-10-CM | POA: Diagnosis not present

## 2022-06-17 DIAGNOSIS — H2511 Age-related nuclear cataract, right eye: Secondary | ICD-10-CM | POA: Diagnosis not present

## 2022-06-17 DIAGNOSIS — M3500 Sicca syndrome, unspecified: Secondary | ICD-10-CM | POA: Diagnosis not present

## 2022-06-17 DIAGNOSIS — H2513 Age-related nuclear cataract, bilateral: Secondary | ICD-10-CM | POA: Diagnosis not present

## 2022-06-17 DIAGNOSIS — H0102A Squamous blepharitis right eye, upper and lower eyelids: Secondary | ICD-10-CM | POA: Diagnosis not present

## 2022-06-22 ENCOUNTER — Encounter (INDEPENDENT_AMBULATORY_CARE_PROVIDER_SITE_OTHER): Payer: PPO | Admitting: Ophthalmology

## 2022-06-22 DIAGNOSIS — H353132 Nonexudative age-related macular degeneration, bilateral, intermediate dry stage: Secondary | ICD-10-CM | POA: Diagnosis not present

## 2022-06-22 DIAGNOSIS — H2513 Age-related nuclear cataract, bilateral: Secondary | ICD-10-CM | POA: Diagnosis not present

## 2022-06-22 DIAGNOSIS — H35033 Hypertensive retinopathy, bilateral: Secondary | ICD-10-CM

## 2022-06-22 DIAGNOSIS — I10 Essential (primary) hypertension: Secondary | ICD-10-CM | POA: Diagnosis not present

## 2022-07-15 DIAGNOSIS — H25043 Posterior subcapsular polar age-related cataract, bilateral: Secondary | ICD-10-CM | POA: Diagnosis not present

## 2022-07-15 DIAGNOSIS — H0102A Squamous blepharitis right eye, upper and lower eyelids: Secondary | ICD-10-CM | POA: Diagnosis not present

## 2022-07-15 DIAGNOSIS — H2513 Age-related nuclear cataract, bilateral: Secondary | ICD-10-CM | POA: Diagnosis not present

## 2022-07-15 DIAGNOSIS — M3500 Sicca syndrome, unspecified: Secondary | ICD-10-CM | POA: Diagnosis not present

## 2022-08-04 DIAGNOSIS — Z961 Presence of intraocular lens: Secondary | ICD-10-CM | POA: Diagnosis not present

## 2022-08-04 DIAGNOSIS — H269 Unspecified cataract: Secondary | ICD-10-CM | POA: Diagnosis not present

## 2022-08-04 DIAGNOSIS — Z9842 Cataract extraction status, left eye: Secondary | ICD-10-CM | POA: Diagnosis not present

## 2022-08-04 DIAGNOSIS — H2511 Age-related nuclear cataract, right eye: Secondary | ICD-10-CM | POA: Diagnosis not present

## 2022-08-04 DIAGNOSIS — Z9841 Cataract extraction status, right eye: Secondary | ICD-10-CM | POA: Diagnosis not present

## 2022-08-04 DIAGNOSIS — H2512 Age-related nuclear cataract, left eye: Secondary | ICD-10-CM | POA: Diagnosis not present

## 2022-08-11 DIAGNOSIS — H2512 Age-related nuclear cataract, left eye: Secondary | ICD-10-CM | POA: Diagnosis not present

## 2022-08-11 DIAGNOSIS — H269 Unspecified cataract: Secondary | ICD-10-CM | POA: Diagnosis not present

## 2022-08-17 DIAGNOSIS — M810 Age-related osteoporosis without current pathological fracture: Secondary | ICD-10-CM | POA: Diagnosis not present

## 2022-09-04 DIAGNOSIS — M25561 Pain in right knee: Secondary | ICD-10-CM | POA: Diagnosis not present

## 2022-09-04 DIAGNOSIS — Z79899 Other long term (current) drug therapy: Secondary | ICD-10-CM | POA: Diagnosis not present

## 2022-09-04 DIAGNOSIS — M549 Dorsalgia, unspecified: Secondary | ICD-10-CM | POA: Diagnosis not present

## 2022-09-04 DIAGNOSIS — M1711 Unilateral primary osteoarthritis, right knee: Secondary | ICD-10-CM | POA: Diagnosis not present

## 2022-09-04 DIAGNOSIS — M150 Primary generalized (osteo)arthritis: Secondary | ICD-10-CM | POA: Diagnosis not present

## 2022-09-04 DIAGNOSIS — M05742 Rheumatoid arthritis with rheumatoid factor of left hand without organ or systems involvement: Secondary | ICD-10-CM | POA: Diagnosis not present

## 2022-09-04 DIAGNOSIS — M810 Age-related osteoporosis without current pathological fracture: Secondary | ICD-10-CM | POA: Diagnosis not present

## 2022-09-04 DIAGNOSIS — H04129 Dry eye syndrome of unspecified lacrimal gland: Secondary | ICD-10-CM | POA: Diagnosis not present

## 2022-09-04 DIAGNOSIS — M0589 Other rheumatoid arthritis with rheumatoid factor of multiple sites: Secondary | ICD-10-CM | POA: Diagnosis not present

## 2022-09-04 DIAGNOSIS — M05741 Rheumatoid arthritis with rheumatoid factor of right hand without organ or systems involvement: Secondary | ICD-10-CM | POA: Diagnosis not present

## 2022-10-06 ENCOUNTER — Other Ambulatory Visit: Payer: Self-pay | Admitting: Internal Medicine

## 2022-10-06 DIAGNOSIS — Z1231 Encounter for screening mammogram for malignant neoplasm of breast: Secondary | ICD-10-CM

## 2022-11-19 ENCOUNTER — Ambulatory Visit
Admission: RE | Admit: 2022-11-19 | Discharge: 2022-11-19 | Disposition: A | Discharge status: Home / Self Care | Payer: PPO | Source: Ambulatory Visit | Attending: Internal Medicine | Admitting: Internal Medicine

## 2022-11-19 DIAGNOSIS — Z1231 Encounter for screening mammogram for malignant neoplasm of breast: Secondary | ICD-10-CM | POA: Diagnosis not present

## 2022-12-11 DIAGNOSIS — M810 Age-related osteoporosis without current pathological fracture: Secondary | ICD-10-CM | POA: Diagnosis not present

## 2022-12-11 DIAGNOSIS — M1711 Unilateral primary osteoarthritis, right knee: Secondary | ICD-10-CM | POA: Diagnosis not present

## 2022-12-11 DIAGNOSIS — H16143 Punctate keratitis, bilateral: Secondary | ICD-10-CM | POA: Diagnosis not present

## 2022-12-11 DIAGNOSIS — Z79899 Other long term (current) drug therapy: Secondary | ICD-10-CM | POA: Diagnosis not present

## 2022-12-11 DIAGNOSIS — H04129 Dry eye syndrome of unspecified lacrimal gland: Secondary | ICD-10-CM | POA: Diagnosis not present

## 2022-12-11 DIAGNOSIS — M25561 Pain in right knee: Secondary | ICD-10-CM | POA: Diagnosis not present

## 2022-12-11 DIAGNOSIS — Z23 Encounter for immunization: Secondary | ICD-10-CM | POA: Diagnosis not present

## 2022-12-11 DIAGNOSIS — M150 Primary generalized (osteo)arthritis: Secondary | ICD-10-CM | POA: Diagnosis not present

## 2022-12-11 DIAGNOSIS — M0589 Other rheumatoid arthritis with rheumatoid factor of multiple sites: Secondary | ICD-10-CM | POA: Diagnosis not present

## 2022-12-11 DIAGNOSIS — M549 Dorsalgia, unspecified: Secondary | ICD-10-CM | POA: Diagnosis not present

## 2022-12-22 DIAGNOSIS — E039 Hypothyroidism, unspecified: Secondary | ICD-10-CM | POA: Diagnosis not present

## 2022-12-22 DIAGNOSIS — I2510 Atherosclerotic heart disease of native coronary artery without angina pectoris: Secondary | ICD-10-CM | POA: Diagnosis not present

## 2022-12-22 DIAGNOSIS — M810 Age-related osteoporosis without current pathological fracture: Secondary | ICD-10-CM | POA: Diagnosis not present

## 2022-12-25 DIAGNOSIS — E039 Hypothyroidism, unspecified: Secondary | ICD-10-CM | POA: Diagnosis not present

## 2022-12-25 DIAGNOSIS — I7300 Raynaud's syndrome without gangrene: Secondary | ICD-10-CM | POA: Diagnosis not present

## 2022-12-25 DIAGNOSIS — I700 Atherosclerosis of aorta: Secondary | ICD-10-CM | POA: Diagnosis not present

## 2022-12-25 DIAGNOSIS — M810 Age-related osteoporosis without current pathological fracture: Secondary | ICD-10-CM | POA: Diagnosis not present

## 2022-12-25 DIAGNOSIS — I2510 Atherosclerotic heart disease of native coronary artery without angina pectoris: Secondary | ICD-10-CM | POA: Diagnosis not present

## 2022-12-25 DIAGNOSIS — M3500 Sicca syndrome, unspecified: Secondary | ICD-10-CM | POA: Diagnosis not present

## 2022-12-25 DIAGNOSIS — E875 Hyperkalemia: Secondary | ICD-10-CM | POA: Diagnosis not present

## 2022-12-25 DIAGNOSIS — Z0000 Encounter for general adult medical examination without abnormal findings: Secondary | ICD-10-CM | POA: Diagnosis not present

## 2022-12-25 DIAGNOSIS — M0579 Rheumatoid arthritis with rheumatoid factor of multiple sites without organ or systems involvement: Secondary | ICD-10-CM | POA: Diagnosis not present

## 2023-01-12 DIAGNOSIS — D485 Neoplasm of uncertain behavior of skin: Secondary | ICD-10-CM | POA: Diagnosis not present

## 2023-01-12 DIAGNOSIS — L738 Other specified follicular disorders: Secondary | ICD-10-CM | POA: Diagnosis not present

## 2023-01-12 DIAGNOSIS — D1801 Hemangioma of skin and subcutaneous tissue: Secondary | ICD-10-CM | POA: Diagnosis not present

## 2023-01-12 DIAGNOSIS — Z85828 Personal history of other malignant neoplasm of skin: Secondary | ICD-10-CM | POA: Diagnosis not present

## 2023-01-12 DIAGNOSIS — L821 Other seborrheic keratosis: Secondary | ICD-10-CM | POA: Diagnosis not present

## 2023-01-12 DIAGNOSIS — D2371 Other benign neoplasm of skin of right lower limb, including hip: Secondary | ICD-10-CM | POA: Diagnosis not present

## 2023-01-12 DIAGNOSIS — L720 Epidermal cyst: Secondary | ICD-10-CM | POA: Diagnosis not present

## 2023-01-12 DIAGNOSIS — L814 Other melanin hyperpigmentation: Secondary | ICD-10-CM | POA: Diagnosis not present

## 2023-01-12 DIAGNOSIS — C44519 Basal cell carcinoma of skin of other part of trunk: Secondary | ICD-10-CM | POA: Diagnosis not present

## 2023-01-18 DIAGNOSIS — Z01419 Encounter for gynecological examination (general) (routine) without abnormal findings: Secondary | ICD-10-CM | POA: Diagnosis not present

## 2023-01-26 DIAGNOSIS — C44519 Basal cell carcinoma of skin of other part of trunk: Secondary | ICD-10-CM | POA: Diagnosis not present

## 2023-02-18 DIAGNOSIS — M810 Age-related osteoporosis without current pathological fracture: Secondary | ICD-10-CM | POA: Diagnosis not present

## 2023-03-03 ENCOUNTER — Encounter: Payer: Self-pay | Admitting: Podiatry

## 2023-03-03 ENCOUNTER — Ambulatory Visit: Payer: PPO | Admitting: Podiatry

## 2023-03-03 ENCOUNTER — Ambulatory Visit (INDEPENDENT_AMBULATORY_CARE_PROVIDER_SITE_OTHER): Payer: PPO

## 2023-03-03 DIAGNOSIS — M778 Other enthesopathies, not elsewhere classified: Secondary | ICD-10-CM | POA: Diagnosis not present

## 2023-03-03 DIAGNOSIS — M779 Enthesopathy, unspecified: Secondary | ICD-10-CM

## 2023-03-03 MED ORDER — TRIAMCINOLONE ACETONIDE 10 MG/ML IJ SUSP
10.0000 mg | Freq: Once | INTRAMUSCULAR | Status: AC
  Administered 2023-03-03: 10 mg via INTRA_ARTICULAR

## 2023-03-07 NOTE — Progress Notes (Signed)
Subjective:   Patient ID: Sheryl Foster, female   DOB: 77 y.o.   MRN: [id number]   HPI Patient presents with a lot of pain in the right arch at the posterior tibial insertion with inflammation fluid buildup around this area and hard to walk with   ROS      Objective:  Physical Exam  Neurovascular status intact moderate depression of the arch inflammation around the right insertion of the posterior tibial tendon navicular and slightly distal to this point     Assessment:  Posterior tibial tendinitis right with inflammation fluid buildup     Plan:  H&P reviewed sterile prep injected the insertion 3 mg Dexasone Kenalog 5 mg Xylocaine after explaining risk discussed supportive shoes and reducing activity and reappoint to recheck  X-rays were negative for signs of fracture or bony injury

## 2023-03-12 DIAGNOSIS — M549 Dorsalgia, unspecified: Secondary | ICD-10-CM | POA: Diagnosis not present

## 2023-03-12 DIAGNOSIS — M810 Age-related osteoporosis without current pathological fracture: Secondary | ICD-10-CM | POA: Diagnosis not present

## 2023-03-12 DIAGNOSIS — H04129 Dry eye syndrome of unspecified lacrimal gland: Secondary | ICD-10-CM | POA: Diagnosis not present

## 2023-03-12 DIAGNOSIS — Z79899 Other long term (current) drug therapy: Secondary | ICD-10-CM | POA: Diagnosis not present

## 2023-03-12 DIAGNOSIS — M79671 Pain in right foot: Secondary | ICD-10-CM | POA: Diagnosis not present

## 2023-03-12 DIAGNOSIS — M150 Primary generalized (osteo)arthritis: Secondary | ICD-10-CM | POA: Diagnosis not present

## 2023-03-12 DIAGNOSIS — M0589 Other rheumatoid arthritis with rheumatoid factor of multiple sites: Secondary | ICD-10-CM | POA: Diagnosis not present

## 2023-04-08 DIAGNOSIS — Z961 Presence of intraocular lens: Secondary | ICD-10-CM | POA: Diagnosis not present

## 2023-04-08 DIAGNOSIS — H0288A Meibomian gland dysfunction right eye, upper and lower eyelids: Secondary | ICD-10-CM | POA: Diagnosis not present

## 2023-04-08 DIAGNOSIS — H16223 Keratoconjunctivitis sicca, not specified as Sjogren's, bilateral: Secondary | ICD-10-CM | POA: Diagnosis not present

## 2023-04-08 DIAGNOSIS — H0288B Meibomian gland dysfunction left eye, upper and lower eyelids: Secondary | ICD-10-CM | POA: Diagnosis not present

## 2023-06-24 DIAGNOSIS — H04223 Epiphora due to insufficient drainage, bilateral lacrimal glands: Secondary | ICD-10-CM | POA: Diagnosis not present

## 2023-06-24 DIAGNOSIS — M3501 Sicca syndrome with keratoconjunctivitis: Secondary | ICD-10-CM | POA: Diagnosis not present

## 2023-06-24 DIAGNOSIS — H0288B Meibomian gland dysfunction left eye, upper and lower eyelids: Secondary | ICD-10-CM | POA: Diagnosis not present

## 2023-06-24 DIAGNOSIS — H16142 Punctate keratitis, left eye: Secondary | ICD-10-CM | POA: Diagnosis not present

## 2023-06-24 DIAGNOSIS — H0288A Meibomian gland dysfunction right eye, upper and lower eyelids: Secondary | ICD-10-CM | POA: Diagnosis not present

## 2023-06-25 DIAGNOSIS — M549 Dorsalgia, unspecified: Secondary | ICD-10-CM | POA: Diagnosis not present

## 2023-06-25 DIAGNOSIS — M810 Age-related osteoporosis without current pathological fracture: Secondary | ICD-10-CM | POA: Diagnosis not present

## 2023-06-25 DIAGNOSIS — Z79899 Other long term (current) drug therapy: Secondary | ICD-10-CM | POA: Diagnosis not present

## 2023-06-25 DIAGNOSIS — M150 Primary generalized (osteo)arthritis: Secondary | ICD-10-CM | POA: Diagnosis not present

## 2023-06-25 DIAGNOSIS — M0589 Other rheumatoid arthritis with rheumatoid factor of multiple sites: Secondary | ICD-10-CM | POA: Diagnosis not present

## 2023-06-25 DIAGNOSIS — H04129 Dry eye syndrome of unspecified lacrimal gland: Secondary | ICD-10-CM | POA: Diagnosis not present

## 2023-08-05 DIAGNOSIS — H0288A Meibomian gland dysfunction right eye, upper and lower eyelids: Secondary | ICD-10-CM | POA: Diagnosis not present

## 2023-08-05 DIAGNOSIS — H16223 Keratoconjunctivitis sicca, not specified as Sjogren's, bilateral: Secondary | ICD-10-CM | POA: Diagnosis not present

## 2023-08-05 DIAGNOSIS — H16142 Punctate keratitis, left eye: Secondary | ICD-10-CM | POA: Diagnosis not present

## 2023-08-05 DIAGNOSIS — H0288B Meibomian gland dysfunction left eye, upper and lower eyelids: Secondary | ICD-10-CM | POA: Diagnosis not present

## 2023-08-18 IMAGING — CT CT CARDIAC CORONARY ARTERY CALCIUM SCORE
3 series · 12 of 20 positions shown, 14 images · non-contrast
Comparison: None.

CLINICAL DATA: 76-year-old Caucasian female with family history of
heart disease.

EXAM:
CT CARDIAC CORONARY ARTERY CALCIUM SCORE
TECHNIQUE: Non-contrast imaging through the heart was performed using
prospective ECG gating. Image post processing was performed on an
independent workstation, allowing for quantitative analysis of the
heart and coronary arteries. Note that this exam targets the heart
and the chest was not imaged in its entirety.

[Series 2: calcium scoring 2.00 qr36 bestdiast 69% hrt calciu · axial · 0.36mm/px · z∈[+1622,+1650]mm · 2 of 70 slices shown]
[im 14/70  vessel]
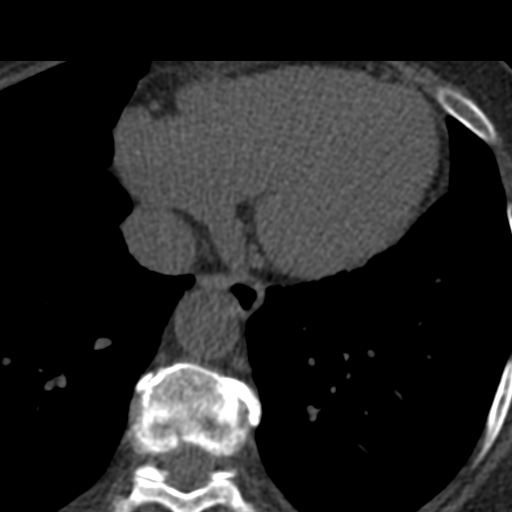
[im 28/70  vessel]
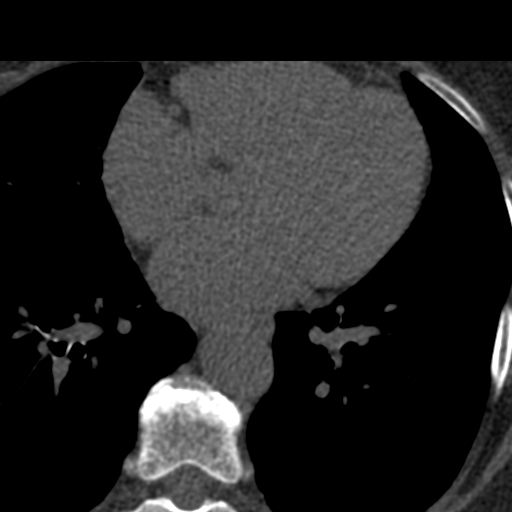

[Series 3: calcium scoring 2.00 br40 bestdiast 69% axial · axial · 0.49mm/px · z∈[+1618,+1710]mm · 5 of 70 slices shown, 7 images]
[im 12/70  vessel]
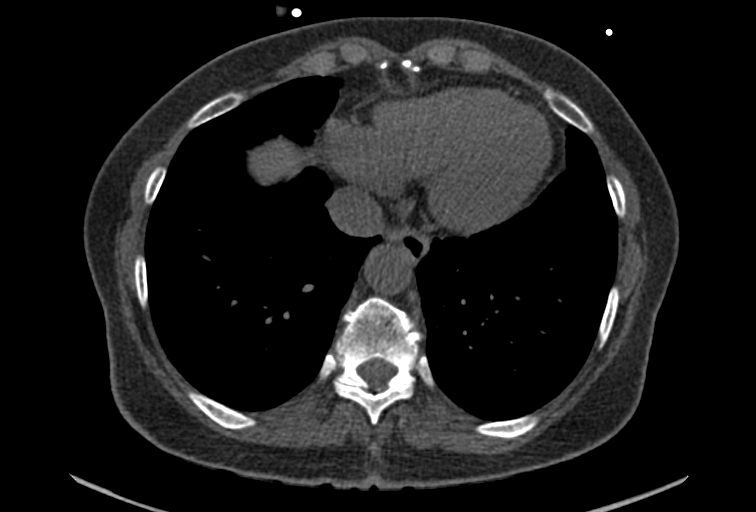
[im 12/70  lung]
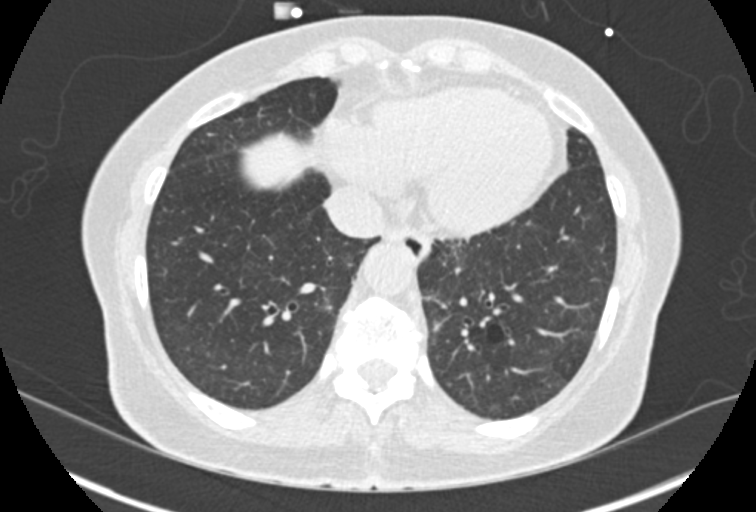
[im 24/70  vessel]
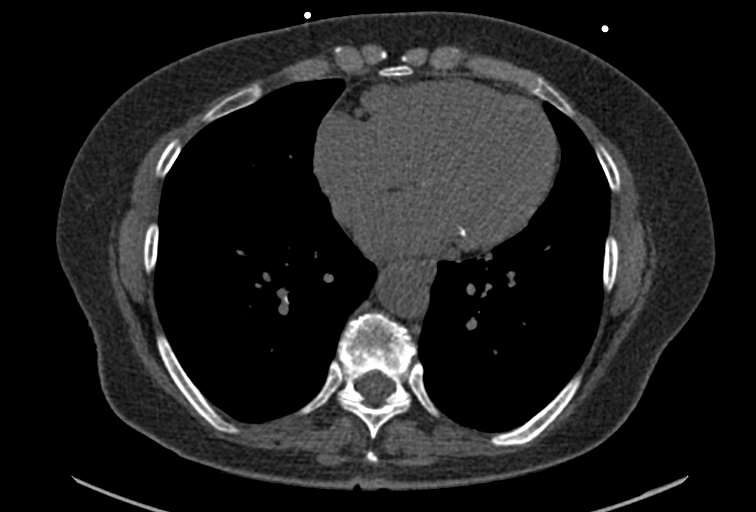
[im 35/70  vessel]
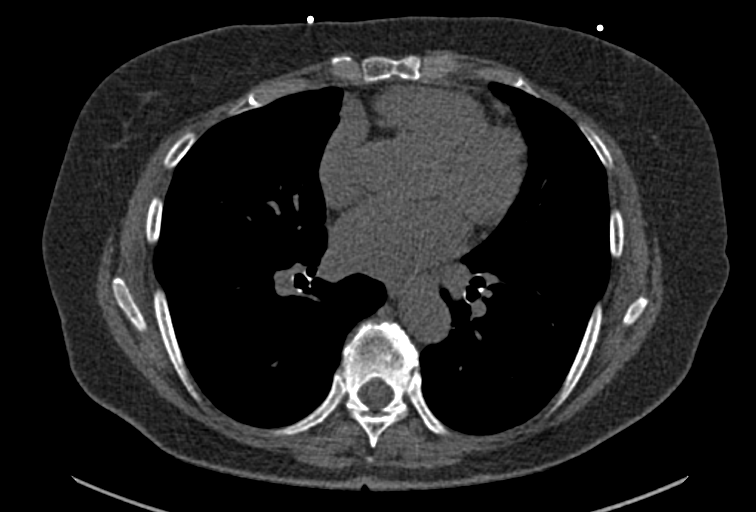
[im 47/70  vessel]
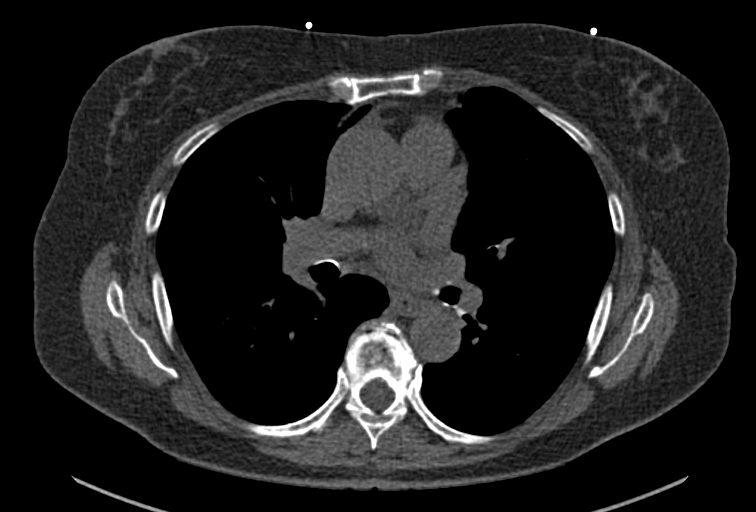
[im 58/70  vessel]
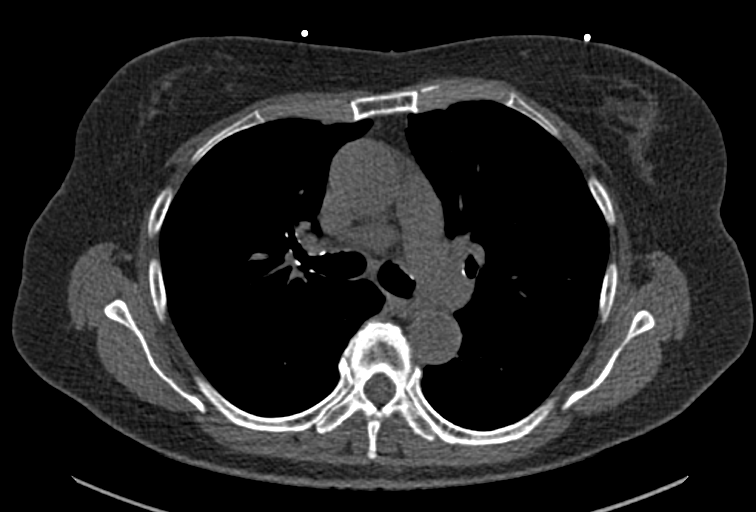
[im 58/70  lung]
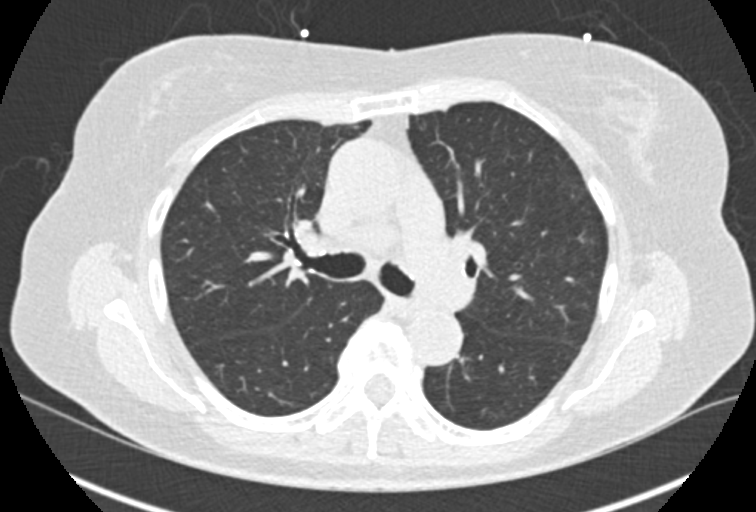

[Series 9: calcium scoring 2.00 br60 bestdiast 69% lungs · axial · 0.49mm/px · z∈[+1618,+1710]mm · 5 of 70 slices shown]
[im 12/70  vessel]
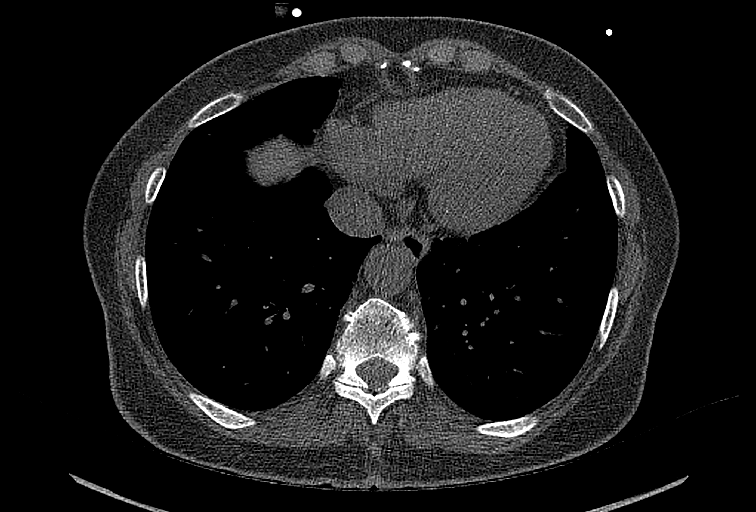
[im 24/70  vessel]
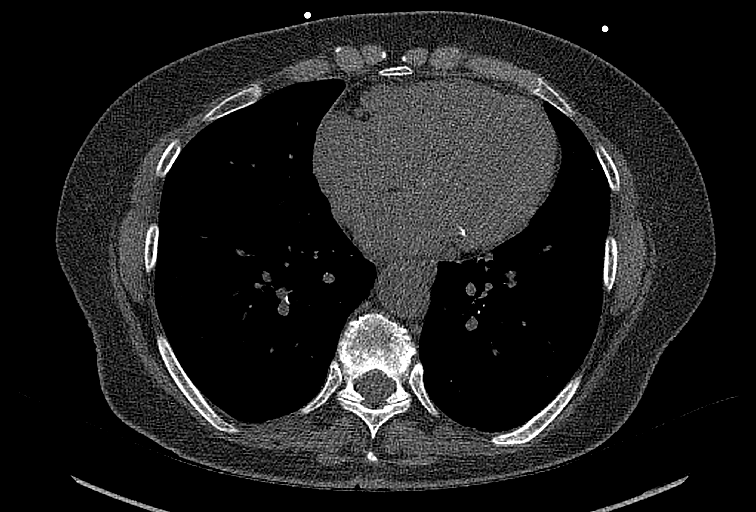
[im 35/70  vessel]
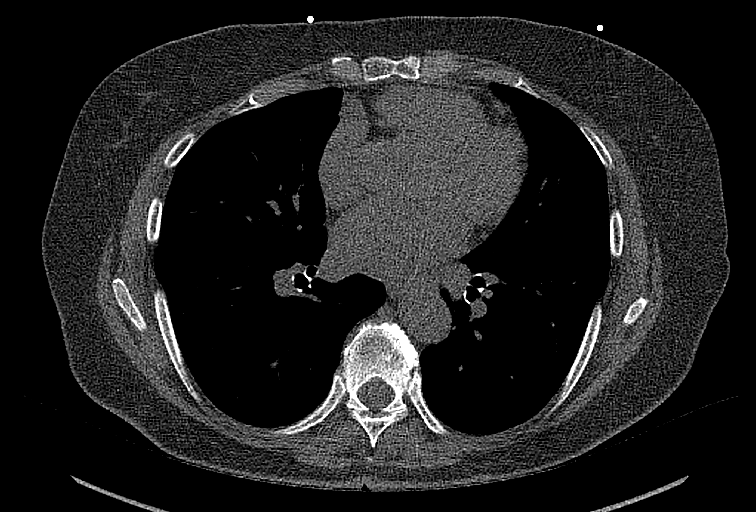
[im 47/70  vessel]
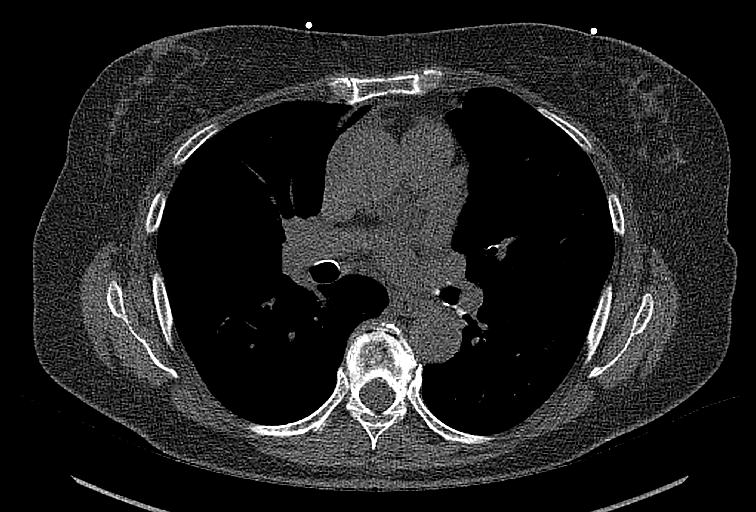
[im 58/70  vessel]
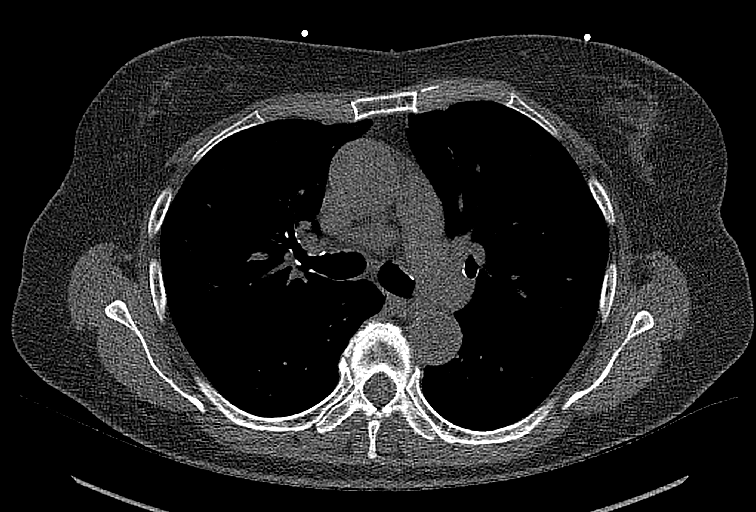

[12 of 20 positions shown; findings below may reference images not displayed]

FINDINGS: CORONARY CALCIUM SCORES:

Left Main: 0

LAD: 72

LCx: 0

RCA: 0

Total Agatston Score: 73

[HOSPITAL] percentile: 50

AORTA MEASUREMENTS:

Ascending Aorta: 35 mm

Descending Aorta: 22 mm

OTHER FINDINGS:

The heart size is within normal limits. No pericardial fluid is
identified. Atherosclerosis of the thoracic aorta at the level of
the arch. Visualized segments of the thoracic aorta and central
pulmonary arteries are normal in caliber. Visualized mediastinum and
hilar regions demonstrate no lymphadenopathy or masses. Visualized
lungs show no evidence of pulmonary edema, consolidation,
pneumothorax, nodule or pleural fluid. Visualized upper abdomen and
bony structures are unremarkable.
IMPRESSION: 1. Coronary calcium score 73 is at the 50th percentile for the
patient's age, sex and race.
2. Atherosclerosis of the thoracic aorta.

## 2023-08-19 DIAGNOSIS — M810 Age-related osteoporosis without current pathological fracture: Secondary | ICD-10-CM | POA: Diagnosis not present

## 2023-09-21 DIAGNOSIS — H0288A Meibomian gland dysfunction right eye, upper and lower eyelids: Secondary | ICD-10-CM | POA: Diagnosis not present

## 2023-09-21 DIAGNOSIS — H16223 Keratoconjunctivitis sicca, not specified as Sjogren's, bilateral: Secondary | ICD-10-CM | POA: Diagnosis not present

## 2023-09-21 DIAGNOSIS — H0288B Meibomian gland dysfunction left eye, upper and lower eyelids: Secondary | ICD-10-CM | POA: Diagnosis not present

## 2023-10-08 DIAGNOSIS — M0589 Other rheumatoid arthritis with rheumatoid factor of multiple sites: Secondary | ICD-10-CM | POA: Diagnosis not present

## 2023-10-08 DIAGNOSIS — M549 Dorsalgia, unspecified: Secondary | ICD-10-CM | POA: Diagnosis not present

## 2023-10-08 DIAGNOSIS — M150 Primary generalized (osteo)arthritis: Secondary | ICD-10-CM | POA: Diagnosis not present

## 2023-10-08 DIAGNOSIS — Z79899 Other long term (current) drug therapy: Secondary | ICD-10-CM | POA: Diagnosis not present

## 2023-10-08 DIAGNOSIS — M810 Age-related osteoporosis without current pathological fracture: Secondary | ICD-10-CM | POA: Diagnosis not present

## 2023-10-08 DIAGNOSIS — H04129 Dry eye syndrome of unspecified lacrimal gland: Secondary | ICD-10-CM | POA: Diagnosis not present

## 2023-10-20 ENCOUNTER — Other Ambulatory Visit: Payer: Self-pay | Admitting: Internal Medicine

## 2023-10-20 DIAGNOSIS — Z1231 Encounter for screening mammogram for malignant neoplasm of breast: Secondary | ICD-10-CM

## 2023-11-08 DIAGNOSIS — H16223 Keratoconjunctivitis sicca, not specified as Sjogren's, bilateral: Secondary | ICD-10-CM | POA: Diagnosis not present

## 2023-11-08 DIAGNOSIS — H0288B Meibomian gland dysfunction left eye, upper and lower eyelids: Secondary | ICD-10-CM | POA: Diagnosis not present

## 2023-11-08 DIAGNOSIS — H0288A Meibomian gland dysfunction right eye, upper and lower eyelids: Secondary | ICD-10-CM | POA: Diagnosis not present

## 2023-11-22 ENCOUNTER — Ambulatory Visit

## 2023-12-03 ENCOUNTER — Other Ambulatory Visit: Payer: Self-pay | Admitting: Medical Genetics

## 2023-12-09 ENCOUNTER — Ambulatory Visit
Admission: RE | Admit: 2023-12-09 | Discharge: 2023-12-09 | Disposition: A | Discharge status: Home / Self Care | Source: Ambulatory Visit | Attending: Internal Medicine | Admitting: Internal Medicine

## 2023-12-09 DIAGNOSIS — Z1231 Encounter for screening mammogram for malignant neoplasm of breast: Secondary | ICD-10-CM | POA: Diagnosis not present

## 2023-12-14 ENCOUNTER — Other Ambulatory Visit: Payer: Self-pay | Admitting: Internal Medicine

## 2023-12-14 DIAGNOSIS — R928 Other abnormal and inconclusive findings on diagnostic imaging of breast: Secondary | ICD-10-CM

## 2023-12-21 ENCOUNTER — Ambulatory Visit
Admission: RE | Admit: 2023-12-21 | Discharge: 2023-12-21 | Disposition: A | Discharge status: Home / Self Care | Source: Ambulatory Visit | Attending: Internal Medicine | Admitting: Internal Medicine

## 2023-12-21 ENCOUNTER — Ambulatory Visit

## 2023-12-21 DIAGNOSIS — R928 Other abnormal and inconclusive findings on diagnostic imaging of breast: Secondary | ICD-10-CM

## 2023-12-23 DIAGNOSIS — I2510 Atherosclerotic heart disease of native coronary artery without angina pectoris: Secondary | ICD-10-CM | POA: Diagnosis not present

## 2023-12-23 DIAGNOSIS — E039 Hypothyroidism, unspecified: Secondary | ICD-10-CM | POA: Diagnosis not present

## 2023-12-23 DIAGNOSIS — M810 Age-related osteoporosis without current pathological fracture: Secondary | ICD-10-CM | POA: Diagnosis not present

## 2023-12-28 DIAGNOSIS — M810 Age-related osteoporosis without current pathological fracture: Secondary | ICD-10-CM | POA: Diagnosis not present

## 2023-12-28 DIAGNOSIS — E039 Hypothyroidism, unspecified: Secondary | ICD-10-CM | POA: Diagnosis not present

## 2023-12-28 DIAGNOSIS — Z23 Encounter for immunization: Secondary | ICD-10-CM | POA: Diagnosis not present

## 2023-12-28 DIAGNOSIS — M25473 Effusion, unspecified ankle: Secondary | ICD-10-CM | POA: Diagnosis not present

## 2023-12-28 DIAGNOSIS — I2510 Atherosclerotic heart disease of native coronary artery without angina pectoris: Secondary | ICD-10-CM | POA: Diagnosis not present

## 2023-12-28 DIAGNOSIS — Z0000 Encounter for general adult medical examination without abnormal findings: Secondary | ICD-10-CM | POA: Diagnosis not present

## 2023-12-28 DIAGNOSIS — M0579 Rheumatoid arthritis with rheumatoid factor of multiple sites without organ or systems involvement: Secondary | ICD-10-CM | POA: Diagnosis not present

## 2024-01-11 DIAGNOSIS — H04129 Dry eye syndrome of unspecified lacrimal gland: Secondary | ICD-10-CM | POA: Diagnosis not present

## 2024-01-11 DIAGNOSIS — Z79899 Other long term (current) drug therapy: Secondary | ICD-10-CM | POA: Diagnosis not present

## 2024-01-11 DIAGNOSIS — M150 Primary generalized (osteo)arthritis: Secondary | ICD-10-CM | POA: Diagnosis not present

## 2024-01-11 DIAGNOSIS — M0589 Other rheumatoid arthritis with rheumatoid factor of multiple sites: Secondary | ICD-10-CM | POA: Diagnosis not present

## 2024-01-11 DIAGNOSIS — M810 Age-related osteoporosis without current pathological fracture: Secondary | ICD-10-CM | POA: Diagnosis not present

## 2024-01-11 DIAGNOSIS — M549 Dorsalgia, unspecified: Secondary | ICD-10-CM | POA: Diagnosis not present

## 2024-02-02 DIAGNOSIS — B353 Tinea pedis: Secondary | ICD-10-CM | POA: Diagnosis not present

## 2024-02-02 DIAGNOSIS — L738 Other specified follicular disorders: Secondary | ICD-10-CM | POA: Diagnosis not present

## 2024-02-02 DIAGNOSIS — L814 Other melanin hyperpigmentation: Secondary | ICD-10-CM | POA: Diagnosis not present

## 2024-02-02 DIAGNOSIS — L720 Epidermal cyst: Secondary | ICD-10-CM | POA: Diagnosis not present

## 2024-02-02 DIAGNOSIS — L821 Other seborrheic keratosis: Secondary | ICD-10-CM | POA: Diagnosis not present

## 2024-02-02 DIAGNOSIS — Z85828 Personal history of other malignant neoplasm of skin: Secondary | ICD-10-CM | POA: Diagnosis not present

## 2024-02-02 DIAGNOSIS — D1801 Hemangioma of skin and subcutaneous tissue: Secondary | ICD-10-CM | POA: Diagnosis not present

## 2024-02-10 ENCOUNTER — Other Ambulatory Visit (HOSPITAL_COMMUNITY): Payer: Self-pay

## 2024-02-21 DIAGNOSIS — M810 Age-related osteoporosis without current pathological fracture: Secondary | ICD-10-CM | POA: Diagnosis not present

## 2024-03-14 ENCOUNTER — Other Ambulatory Visit

## 2024-03-14 DIAGNOSIS — Z006 Encounter for examination for normal comparison and control in clinical research program: Secondary | ICD-10-CM

## 2024-03-31 LAB — GENECONNECT MOLECULAR SCREEN: Genetic Analysis Overall Interpretation: NEGATIVE
